# Patient Record
Sex: Male | Born: 1950
Health system: Southern US, Community
[De-identification: ages and names within clinical notes are randomized; demographics above are authoritative.]

## PROBLEM LIST (undated history)

## (undated) DIAGNOSIS — M1990 Unspecified osteoarthritis, unspecified site: Secondary | ICD-10-CM

## (undated) DIAGNOSIS — Z87442 Personal history of urinary calculi: Secondary | ICD-10-CM

## (undated) DIAGNOSIS — E785 Hyperlipidemia, unspecified: Secondary | ICD-10-CM

## (undated) DIAGNOSIS — B019 Varicella without complication: Secondary | ICD-10-CM

## (undated) DIAGNOSIS — H269 Unspecified cataract: Secondary | ICD-10-CM

## (undated) DIAGNOSIS — C801 Malignant (primary) neoplasm, unspecified: Secondary | ICD-10-CM

## (undated) DIAGNOSIS — R7303 Prediabetes: Secondary | ICD-10-CM

## (undated) DIAGNOSIS — N200 Calculus of kidney: Secondary | ICD-10-CM

## (undated) DIAGNOSIS — E119 Type 2 diabetes mellitus without complications: Secondary | ICD-10-CM

## (undated) HISTORY — DX: Malignant (primary) neoplasm, unspecified: C80.1

## (undated) HISTORY — DX: Varicella without complication: B01.9

## (undated) HISTORY — DX: Type 2 diabetes mellitus without complications: E11.9

## (undated) HISTORY — DX: Unspecified cataract: H26.9

## (undated) HISTORY — DX: Calculus of kidney: N20.0

## (undated) HISTORY — DX: Hyperlipidemia, unspecified: E78.5

## (undated) HISTORY — PX: OTHER SURGICAL HISTORY: SHX169

---
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
## 2003-01-30 HISTORY — PX: COLONOSCOPY: SHX174

## 2009-05-06 ENCOUNTER — Ambulatory Visit: Payer: Self-pay | Admitting: Diagnostic Radiology

## 2009-05-06 ENCOUNTER — Emergency Department (HOSPITAL_BASED_OUTPATIENT_CLINIC_OR_DEPARTMENT_OTHER): Admission: EM | Admit: 2009-05-06 | Discharge: 2009-05-06 | Payer: Self-pay | Admitting: Emergency Medicine

## 2011-01-09 LAB — URINALYSIS, ROUTINE W REFLEX MICROSCOPIC
Bilirubin Urine: NEGATIVE
Ketones, ur: NEGATIVE mg/dL
Nitrite: NEGATIVE
Specific Gravity, Urine: 1.019 (ref 1.005–1.030)
Urobilinogen, UA: 0.2 mg/dL (ref 0.0–1.0)
pH: 5.5 (ref 5.0–8.0)

## 2011-01-09 LAB — URINE MICROSCOPIC-ADD ON

## 2015-06-10 DIAGNOSIS — Z23 Encounter for immunization: Secondary | ICD-10-CM | POA: Diagnosis not present

## 2016-01-08 ENCOUNTER — Ambulatory Visit: Payer: Self-pay | Admitting: Family Medicine

## 2016-04-12 ENCOUNTER — Ambulatory Visit: Payer: Self-pay | Admitting: Family Medicine

## 2016-05-19 ENCOUNTER — Telehealth: Payer: Self-pay | Admitting: *Deleted

## 2016-05-19 NOTE — Telephone Encounter (Signed)
Unable to reach patient at time of Pre-Visit Call.  Left message for patient to return call when available.    

## 2016-05-20 ENCOUNTER — Encounter: Payer: Self-pay | Admitting: Family Medicine

## 2016-05-20 ENCOUNTER — Ambulatory Visit (INDEPENDENT_AMBULATORY_CARE_PROVIDER_SITE_OTHER): Payer: Medicare Other | Admitting: Family Medicine

## 2016-05-20 VITALS — BP 136/86 | HR 73 | Temp 98.4°F | Ht 69.0 in | Wt 257.6 lb

## 2016-05-20 DIAGNOSIS — Z130 Encounter for screening for diseases of the blood and blood-forming organs and certain disorders involving the immune mechanism: Secondary | ICD-10-CM

## 2016-05-20 DIAGNOSIS — Z13220 Encounter for screening for lipoid disorders: Secondary | ICD-10-CM

## 2016-05-20 DIAGNOSIS — E663 Overweight: Secondary | ICD-10-CM | POA: Diagnosis not present

## 2016-05-20 DIAGNOSIS — Z131 Encounter for screening for diabetes mellitus: Secondary | ICD-10-CM

## 2016-05-20 DIAGNOSIS — Z5181 Encounter for therapeutic drug level monitoring: Secondary | ICD-10-CM

## 2016-05-20 DIAGNOSIS — Z125 Encounter for screening for malignant neoplasm of prostate: Secondary | ICD-10-CM

## 2016-05-20 DIAGNOSIS — Z119 Encounter for screening for infectious and parasitic diseases, unspecified: Secondary | ICD-10-CM | POA: Diagnosis not present

## 2016-05-20 DIAGNOSIS — R972 Elevated prostate specific antigen [PSA]: Secondary | ICD-10-CM

## 2016-05-20 DIAGNOSIS — R0683 Snoring: Secondary | ICD-10-CM

## 2016-05-20 DIAGNOSIS — Z23 Encounter for immunization: Secondary | ICD-10-CM | POA: Diagnosis not present

## 2016-05-20 LAB — CBC
HEMATOCRIT: 42.5 % (ref 39.0–52.0)
HEMOGLOBIN: 14.4 g/dL (ref 13.0–17.0)
MCHC: 34 g/dL (ref 30.0–36.0)
MCV: 96.2 fl (ref 78.0–100.0)
Platelets: 266 10*3/uL (ref 150.0–400.0)
RBC: 4.42 Mil/uL (ref 4.22–5.81)
RDW: 14 % (ref 11.5–15.5)
WBC: 5.6 10*3/uL (ref 4.0–10.5)

## 2016-05-20 LAB — COMPREHENSIVE METABOLIC PANEL
ALT: 18 U/L (ref 0–53)
AST: 16 U/L (ref 0–37)
Albumin: 4.3 g/dL (ref 3.5–5.2)
Alkaline Phosphatase: 69 U/L (ref 39–117)
BUN: 21 mg/dL (ref 6–23)
CHLORIDE: 108 meq/L (ref 96–112)
CO2: 27 meq/L (ref 19–32)
Calcium: 9.5 mg/dL (ref 8.4–10.5)
Creatinine, Ser: 0.95 mg/dL (ref 0.40–1.50)
GFR: 84.17 mL/min (ref >60.00)
GLUCOSE: 113 mg/dL — AB (ref 70–99)
POTASSIUM: 4.3 meq/L (ref 3.5–5.1)
Sodium: 142 mEq/L (ref 135–145)
Total Bilirubin: 0.7 mg/dL (ref 0.2–1.2)
Total Protein: 6.8 g/dL (ref 6.0–8.3)

## 2016-05-20 LAB — LIPID PANEL
CHOL/HDL RATIO: 3
Cholesterol: 190 mg/dL (ref 0–200)
HDL: 54.9 mg/dL (ref >39.00)
LDL CALC: 122 mg/dL — AB (ref 0–99)
NONHDL: 135.29
Triglycerides: 64 mg/dL (ref 0.0–149.0)
VLDL: 12.8 mg/dL (ref 0.0–40.0)

## 2016-05-20 LAB — PSA, MEDICARE: PSA: 6.29 ng/ml — ABNORMAL HIGH (ref 0.10–4.00)

## 2016-05-20 LAB — HEPATITIS C ANTIBODY: HCV Ab: NEGATIVE

## 2016-05-20 NOTE — Progress Notes (Addendum)
[place] at [facility] [address], [place], [place] [address] [phone]  Date:  05/20/2016   Name:  Jason Obrien   DOB:  [birth year]   MRN:  [id number]  PCP:  Lamar Blinks, MD    Chief Complaint: Establish Care (Pt is here to est care. )   History of Present Illness:  Jason Obrien is a 65 y.o. very pleasant male patient who presents with the following:  He is here today as a new patient- he was a [place] pt in the past but does not really seek medical care.   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   No recent BW- he is fasting today so we can draw blood.   He is married, his 2 children are 87 and 54 yo, doing well.  They live pretty close; 1 grandson born last year.    His wife has noted that he snores- and does seem to have some episodes where he will stop breathing.  He does not feel tired during the day, does not feel sleepy or somnolent  His father had OSA treated with CPAP He did have a colonoscopy in 2004;  He plans to do this soon   There are no active problems to display for this patient.   Past Medical History:  Diagnosis Date  . Chicken pox    childhood  . Kidney stones    In 2013 or 2014    No past surgical history on file.  Social History  Substance Use Topics  . Smoking status: Never Smoker  . Smokeless tobacco: Never Used  . Alcohol use Yes     Comment: Social only     No family history on file.  Allergies not on file  Medication list has been reviewed and updated.  No current outpatient prescriptions on file prior to visit.   No current facility-administered medications on file prior to visit.     Review of Systems:  As per HPI- otherwise negative.   Physical Examination: BP Readings from Last 3 Encounters:  05/20/16 136/86   Vitals:   05/20/16 0834  Pulse: 73   Temp: 98.4 F (36.9 C)   Vitals:   05/20/16 0834  Weight: 257 lb 9.6 oz (116.8 kg)  Height: 5\' 9"  (1.753 m)   Body mass index is 38.04 kg/m. Ideal Body Weight: Weight in (lb) to have BMI = 25: 168.9  GEN: WDWN, NAD, Non-toxic, A & O x 3 HEENT: Atraumatic, Normocephalic. Neck supple. No masses, No LAD. Ears and Nose: No external deformity. CV: RRR, No M/G/R. No JVD. No thrill. No extra heart sounds. PULM: CTA B, no wheezes, crackles, rhonchi. No retractions. No resp. distress. No accessory muscle use. ABD: S, NT, ND, +BS. No rebound. No HSM. EXTR: No c/c/e NEURO Normal gait.  PSYCH: Normally interactive. Conversant. Not depressed or anxious appearing.  Calm demeanor.    Assessment and Plan: Snoring - Plan: Ambulatory referral to Neurology  Screening for deficiency anemia - Plan: CBC  Screening for hyperlipidemia - Plan: Lipid panel  Overweight - Plan: Lipid panel, Ambulatory referral to Neurology  Screening for prostate cancer - Plan: PSA, Medicare  Screening for diabetes mellitus - Plan: Comprehensive metabolic panel  Medication monitoring encounter - Plan: CBC, Comprehensive metabolic panel  Screening examination for infectious  disease - Plan: Hepatitis C antibody  Immunization due - Plan: Pneumococcal conjugate vaccine 13-valent IM  Elevated PSA - Plan: Ambulatory referral to Urology  here today to establish care and address issues as above Will refer to neurology to have a sleep study Will plan further follow- up pending labs.  It was very nice to meet you today- I will be in touch with your labs asap We will arrange for you to have a sleep study with neurology. Please do go ahead with a colonoscopy appt at your convenience.   Losing weight is a great idea- this is a challenge but keep working on it You got your prevnar vaccine today- you will be due for your pneumovax (other pneumonia vaccine) next year You might also consider getting a shingles vaccine from  your pharmacy - wait a month after your prevnar however  Signed Lamar Blinks, MD  Received his labs 8/17, gave him a call.  LMOM; CBC, cholesterol good.  Glucose is borderline- please work on lifestyle and recheck with me in 2 months.  PSA is high- will refer to urology as he may need a bx   Results for orders placed or performed in visit on 05/20/16  CBC  Result Value Ref Range   WBC 5.6 4.0 - 10.5 K/uL   RBC 4.42 4.22 - 5.81 Mil/uL   Platelets 266.0 150.0 - 400.0 K/uL   Hemoglobin 14.4 13.0 - 17.0 g/dL   HCT 42.5 39.0 - 52.0 %   MCV 96.2 78.0 - 100.0 fl   MCHC 34.0 30.0 - 36.0 g/dL   RDW 14.0 11.5 - 15.5 %  Comprehensive metabolic panel  Result Value Ref Range   Sodium 142 135 - 145 mEq/L   Potassium 4.3 3.5 - 5.1 mEq/L   Chloride 108 96 - 112 mEq/L   CO2 27 19 - 32 mEq/L   Glucose, Bld 113 (H) 70 - 99 mg/dL   BUN 21 6 - 23 mg/dL   Creatinine, Ser 0.95 0.40 - 1.50 mg/dL   Total Bilirubin 0.7 0.2 - 1.2 mg/dL   Alkaline Phosphatase 69 39 - 117 U/L   AST 16 0 - 37 U/L   ALT 18 0 - 53 U/L   Total Protein 6.8 6.0 - 8.3 g/dL   Albumin 4.3 3.5 - 5.2 g/dL   Calcium 9.5 8.4 - 10.5 mg/dL   GFR 84.17 >60.00 mL/min  Lipid panel  Result Value Ref Range   Cholesterol 190 0 - 200 mg/dL   Triglycerides 64.0 0.0 - 149.0 mg/dL   HDL 54.90 >39.00 mg/dL   VLDL 12.8 0.0 - 40.0 mg/dL   LDL Cholesterol 122 (H) 0 - 99 mg/dL   Total CHOL/HDL Ratio 3    NonHDL 135.29   PSA, Medicare  Result Value Ref Range   PSA 6.29 (H) 0.10 - 4.00 ng/ml

## 2016-05-20 NOTE — Progress Notes (Signed)
Pre visit review using our clinic review tool, if applicable. No additional management support is needed unless otherwise documented below in the visit note. 

## 2016-05-20 NOTE — Patient Instructions (Addendum)
It was very nice to meet you today- I will be in touch with your labs asap We will arrange for you to have a sleep study with neurology. Please do go ahead with a colonoscopy appt at your convenience.   Losing weight is a great idea- this is a challenge but keep working on it You got your prevnar vaccine today- you will be due for your pneumovax (other pneumonia vaccine) next year You might also consider getting a shingles vaccine from your pharmacy - wait a month after your prevnar however

## 2016-05-20 NOTE — Addendum Note (Signed)
Addended by: Lamar Blinks C on: 05/20/2016 06:08 PM   Modules accepted: Orders

## 2016-05-21 ENCOUNTER — Encounter: Payer: Self-pay | Admitting: Gastroenterology

## 2016-05-27 DIAGNOSIS — Z6836 Body mass index (BMI) 36.0-36.9, adult: Secondary | ICD-10-CM | POA: Diagnosis not present

## 2016-05-27 DIAGNOSIS — R972 Elevated prostate specific antigen [PSA]: Secondary | ICD-10-CM | POA: Diagnosis not present

## 2016-05-31 ENCOUNTER — Encounter: Payer: Self-pay | Admitting: Family Medicine

## 2016-05-31 DIAGNOSIS — R972 Elevated prostate specific antigen [PSA]: Secondary | ICD-10-CM | POA: Insufficient documentation

## 2016-06-10 DIAGNOSIS — C61 Malignant neoplasm of prostate: Secondary | ICD-10-CM | POA: Diagnosis not present

## 2016-06-10 DIAGNOSIS — R972 Elevated prostate specific antigen [PSA]: Secondary | ICD-10-CM | POA: Diagnosis not present

## 2016-06-17 DIAGNOSIS — C61 Malignant neoplasm of prostate: Secondary | ICD-10-CM | POA: Diagnosis not present

## 2016-06-17 DIAGNOSIS — Z6836 Body mass index (BMI) 36.0-36.9, adult: Secondary | ICD-10-CM | POA: Diagnosis not present

## 2016-06-28 DIAGNOSIS — Z6836 Body mass index (BMI) 36.0-36.9, adult: Secondary | ICD-10-CM | POA: Diagnosis not present

## 2016-06-28 DIAGNOSIS — C61 Malignant neoplasm of prostate: Secondary | ICD-10-CM | POA: Diagnosis not present

## 2016-06-28 DIAGNOSIS — Z9889 Other specified postprocedural states: Secondary | ICD-10-CM | POA: Diagnosis not present

## 2016-07-07 DIAGNOSIS — N200 Calculus of kidney: Secondary | ICD-10-CM | POA: Diagnosis not present

## 2016-07-07 DIAGNOSIS — Z6835 Body mass index (BMI) 35.0-35.9, adult: Secondary | ICD-10-CM | POA: Diagnosis not present

## 2016-07-07 DIAGNOSIS — C61 Malignant neoplasm of prostate: Secondary | ICD-10-CM | POA: Diagnosis not present

## 2016-07-21 ENCOUNTER — Encounter: Payer: Self-pay | Admitting: Family Medicine

## 2016-07-21 ENCOUNTER — Ambulatory Visit (INDEPENDENT_AMBULATORY_CARE_PROVIDER_SITE_OTHER): Payer: Medicare Other | Admitting: Family Medicine

## 2016-07-21 VITALS — BP 127/85 | HR 70 | Temp 98.7°F | Ht 69.0 in | Wt 247.2 lb

## 2016-07-21 DIAGNOSIS — Z23 Encounter for immunization: Secondary | ICD-10-CM

## 2016-07-21 DIAGNOSIS — E669 Obesity, unspecified: Secondary | ICD-10-CM

## 2016-07-21 DIAGNOSIS — R7309 Other abnormal glucose: Secondary | ICD-10-CM

## 2016-07-21 DIAGNOSIS — C61 Malignant neoplasm of prostate: Secondary | ICD-10-CM | POA: Diagnosis not present

## 2016-07-21 DIAGNOSIS — R7303 Prediabetes: Secondary | ICD-10-CM | POA: Insufficient documentation

## 2016-07-21 LAB — HEMOGLOBIN A1C: Hgb A1c MFr Bld: 5.9 % (ref 4.6–6.5)

## 2016-07-21 NOTE — Progress Notes (Signed)
Pre visit review using our clinic review tool, if applicable. No additional management support is needed unless otherwise documented below in the visit note. 

## 2016-07-21 NOTE — Progress Notes (Signed)
[place] at [facility] [address], [place], [place] [address] [phone] [id number] [phone]  Date:  07/21/2016   Name:  Jason Obrien   DOB:  06/30/1951   MRN:  [id number]  PCP:  Lamar Blinks, MD    Chief Complaint: Follow-up (Pt here for f/u visit. Pt has not had sleep study yet. Pt states that he was recently dx with prostate cancer and will surgery on Dec. 12th. )   History of Present Illness:  Jason Obrien is a 65 y.o. very pleasant male patient who presents with the following:  Here today for a follow-up visit.  I last saw him in August- HPI from that visit  He is here today as a new patient- he was a [facility] pt in the past but does not really seek medical care.   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   No recent BW- he is fasting today so we can draw blood.   He is married, his 2 children are 61 and 78 yo, doing well.  They live pretty close; 1 grandson born last year.    His wife has noted that he snores- and does seem to have some episodes where he will stop breathing.  He does not feel tired during the day, does not feel sleepy or somnolent  His father had OSA treated with CPAP He did have a colonoscopy in 2004;  He plans to do this soon  He was recently noted to have an elevated PSA and then dx with prostate cancer- he is seeing Dr. Nevada Crane with [facility].  He discussed radiation treatment and robotic prostatectomy. He plans to have surgery in December.  He will have his colonoscopy in November.  The sleep study is on the back burner for the time being.  However he hopes that losing weight will help to reduce snoring.    Wt Readings from Last 3 Encounters:  07/21/16 247 lb 3.2 oz (112.1 kg)  05/20/16 257 lb 9.6 oz (116.8 kg)   He has worked on losing weight- he has lost about 10 lbs since our last visit.  He has started  walking in the mornings, and is following some of his wife's diet   His grandson recently celebrated his 1st b-day in [place]; this was a great event for them  Patient Active Problem List   Diagnosis Date Noted  . Elevated PSA 05/31/2016  . Overweight 05/20/2016    Past Medical History:  Diagnosis Date  . Chicken pox    childhood  . Kidney stones    In 2013 or 2014    Past Surgical History:  Procedure Laterality Date  . OTHER SURGICAL HISTORY     Right arm surgery for compound fracture     Social History  Substance Use Topics  . Smoking status: Never Smoker  . Smokeless tobacco: Never Used  . Alcohol use Yes     Comment: Social only     Family History  Problem Relation Age of Onset  . Hypertension Father   . Heart failure Father     died age 20  . Colon cancer Paternal Grandmother     No Known Allergies  Medication list has been reviewed and updated.  Current Outpatient Prescriptions on File Prior to Visit  Medication Sig Dispense Refill  .  aspirin (BAYER ASPIRIN EC LOW DOSE) 81 MG EC tablet Take 81 mg by mouth daily. Swallow whole.    . Glucosamine-Chondroit-Vit C-Mn (GLUCOSAMINE 1500 COMPLEX) CAPS Take 1 capsule by mouth daily.    . Misc Natural Products (PROSTATE HEALTH) CAPS Take 1 capsule by mouth daily.    . Multiple Vitamins-Minerals (ONE-A-DAY MENS 50+ ADVANTAGE PO) Take 1 capsule by mouth daily.    . Omega-3 Fatty Acids (FISH OIL ULTRA) 1400 MG CAPS Take 1 capsule by mouth daily.     No current facility-administered medications on file prior to visit.     Review of Systems:  As per HPI- otherwise negative.  No fever, chills, CP, SOB.  Emotionally he is doing ok with his recent cancer dx- he knows several people who have been though this and done well so he is hopeful   Physical Examination: Vitals:   07/21/16 0854  BP: 127/85  Pulse: 70  Temp: 98.7 F (37.1 C)   Vitals:   07/21/16 0854  Weight: 247 lb 3.2 oz (112.1 kg)  Height: 5'  9" (1.753 m)   Body mass index is 36.51 kg/m. Ideal Body Weight: Weight in (lb) to have BMI = 25: 168.9  GEN: WDWN, NAD, Non-toxic, A & O x 3, obese but has lost, otherwise looks well HEENT: Atraumatic, Normocephalic. Neck supple. No masses, No LAD. Ears and Nose: No external deformity. CV: RRR, No M/G/R. No JVD. No thrill. No extra heart sounds. PULM: CTA B, no wheezes, crackles, rhonchi. No retractions. No resp. distress. No accessory muscle use. EXTR: No c/c/e NEURO Normal gait.  PSYCH: Normally interactive. Conversant. Not depressed or anxious appearing.  Calm demeanor.    Assessment and Plan: Elevated glucose - Plan: Hemoglobin A1C  Prostate cancer (HCC)  Obesity, unspecified classification, unspecified obesity type, unspecified whether serious comorbidity present  Recent labs showed elevated glucose- check A1c today Flu shot Discussed his prostate cancer- he is confident in his urologist and plan for surgery He has lost weight!  Keep it up  [place], MD

## 2016-07-21 NOTE — Patient Instructions (Signed)
It was great to see you today- you got your flu shot, and we will check an A1c test to get an idea of your average blood sugar level Great job with weight loss and getting in shape!  Keep up the good work We will be thinking of you as you go through your surgery.  Please let me know if we can be of any help to you during this time

## 2016-08-02 ENCOUNTER — Ambulatory Visit (AMBULATORY_SURGERY_CENTER): Payer: Self-pay | Admitting: *Deleted

## 2016-08-02 VITALS — Ht 70.0 in | Wt 251.0 lb

## 2016-08-02 DIAGNOSIS — Z1211 Encounter for screening for malignant neoplasm of colon: Secondary | ICD-10-CM

## 2016-08-02 MED ORDER — NA SULFATE-K SULFATE-MG SULF 17.5-3.13-1.6 GM/177ML PO SOLN: ORAL | 0 refills | Status: DC

## 2016-08-02 NOTE — Progress Notes (Signed)
Patient denies any allergies to eggs or soy. Patient denies any problems with anesthesia/sedation. Patient denies any oxygen use at home and does not take any diet/weight loss medications. Pt declined EMMI education. 

## 2016-08-16 ENCOUNTER — Ambulatory Visit (AMBULATORY_SURGERY_CENTER): Payer: Medicare Other | Admitting: Gastroenterology

## 2016-08-16 ENCOUNTER — Encounter: Payer: Self-pay | Admitting: Gastroenterology

## 2016-08-16 VITALS — BP 111/67 | HR 59 | Temp 96.9°F | Resp 10 | Ht 70.0 in | Wt 251.0 lb

## 2016-08-16 DIAGNOSIS — Z8371 Family history of colonic polyps: Secondary | ICD-10-CM

## 2016-08-16 DIAGNOSIS — Z1212 Encounter for screening for malignant neoplasm of rectum: Secondary | ICD-10-CM

## 2016-08-16 DIAGNOSIS — Z1211 Encounter for screening for malignant neoplasm of colon: Secondary | ICD-10-CM

## 2016-08-16 DIAGNOSIS — K635 Polyp of colon: Secondary | ICD-10-CM | POA: Diagnosis not present

## 2016-08-16 DIAGNOSIS — Z800 Family history of malignant neoplasm of digestive organs: Secondary | ICD-10-CM

## 2016-08-16 DIAGNOSIS — D125 Benign neoplasm of sigmoid colon: Secondary | ICD-10-CM

## 2016-08-16 MED ORDER — SODIUM CHLORIDE 0.9 % IV SOLN: 500.0000 mL | INTRAVENOUS | Status: DC

## 2016-08-16 NOTE — Progress Notes (Signed)
Called to room to assist during endoscopic procedure.  Patient ID and intended procedure confirmed with present staff. Received instructions for my participation in the procedure from the performing physician.  

## 2016-08-16 NOTE — Progress Notes (Signed)
A/ox3, pleased with MAC, report to RN 

## 2016-08-16 NOTE — Op Note (Signed)
[facility] Patient Name: Jason Obrien Procedure Date: 08/16/2016 8:04 AM MRN: [id number] Endoscopist: Ladene Artist , MD Age: 65 Referring MD:  Date of Birth: [birth year] Gender: Male Account #: [id number] Procedure:                Colonoscopy Indications:              Screening for colon cancer: Family history of                            colorectal cancer in distant relative(s), Colon                            cancer screening in patient at increased risk:                            Family history of 1st-degree relative with colon                            polyps Medicines:                Monitored Anesthesia Care Procedure:                Pre-Anesthesia Assessment:                           - Prior to the procedure, a History and Physical                            was performed, and patient medications and                            allergies were reviewed. The patient's tolerance of                            previous anesthesia was also reviewed. The risks                            and benefits of the procedure and the sedation                            options and risks were discussed with the patient.                            All questions were answered, and informed consent                            was obtained. Prior Anticoagulants: The patient has                            taken no previous anticoagulant or antiplatelet                            agents. ASA Grade Assessment: II - A patient with  mild systemic disease. After reviewing the risks                            and benefits, the patient was deemed in                            satisfactory condition to undergo the procedure.                           After obtaining informed consent, the colonoscope                            was passed under direct vision. Throughout the                            procedure, the patient's blood pressure, pulse, and               oxygen saturations were monitored continuously. The                            Model [id number]-[id number]) scope was introduced                            through the anus and advanced to the the cecum,                            identified by appendiceal orifice and ileocecal                            valve. The ileocecal valve, appendiceal orifice,                            and rectum were photographed. The quality of the                            bowel preparation was good. The colonoscopy was                            performed without difficulty. The patient tolerated                            the procedure well. Scope In: 8:08:02 AM Scope Out: 8:22:20 AM Scope Withdrawal Time: 0 hours 12 minutes 28 seconds  Total Procedure Duration: 0 hours 14 minutes 18 seconds  Findings:                 The perianal and digital rectal examinations were                            normal.                           A 6 mm polyp was found in the sigmoid colon. The  polyp was sessile. The polyp was removed with a                            cold snare. Resection and retrieval were complete.                           Internal hemorrhoids were found during                            retroflexion. The hemorrhoids were small and Grade                            I (internal hemorrhoids that do not prolapse).                           Multiple small-mouthed diverticula were found in                            the sigmoid colon and descending colon. There was                            evidence of diverticular spasm.                           The exam was otherwise without abnormality on                            direct and retroflexion views. Complications:            No immediate complications. Estimated blood loss:                            None. Estimated Blood Loss:     Estimated blood loss: none. Impression:               - One 6 mm polyp in the sigmoid  colon, removed with                            a cold snare. Resected and retrieved.                           - Internal hemorrhoids.                           - Moderate diverticulosis in the sigmoid colon and                            in the descending colon.                           - The examination was otherwise normal on direct                            and retroflexion views. Recommendation:           - Repeat colonoscopy in 5 years for surveillance.                           -  Patient has a contact number available for                            emergencies. The signs and symptoms of potential                            delayed complications were discussed with the                            patient. Return to normal activities tomorrow.                            Written discharge instructions were provided to the                            patient.                           - High fiber diet.                           - Continue present medications.                           - Await pathology results. Ladene Artist, MD 08/16/2016 8:25:41 AM This report has been signed electronically.

## 2016-08-16 NOTE — Patient Instructions (Signed)
YOU HAD AN ENDOSCOPIC PROCEDURE TODAY AT THE [facility]:   Refer to the procedure report that was given to you for any specific questions about what was found during the examination.  If the procedure report does not answer your questions, please call your gastroenterologist to clarify.  If you requested that your care partner not be given the details of your procedure findings, then the procedure report has been included in a sealed envelope for you to review at your convenience later.  YOU SHOULD EXPECT: Some feelings of bloating in the abdomen. Passage of more gas than usual.  Walking can help get rid of the air that was put into your GI tract during the procedure and reduce the bloating. If you had a lower endoscopy (such as a colonoscopy or flexible sigmoidoscopy) you may notice spotting of blood in your stool or on the toilet paper. If you underwent a bowel prep for your procedure, you may not have a normal bowel movement for a few days.  Please Note:  You might notice some irritation and congestion in your nose or some drainage.  This is from the oxygen used during your procedure.  There is no need for concern and it should clear up in a day or so.  SYMPTOMS TO REPORT IMMEDIATELY:   Following lower endoscopy (colonoscopy or flexible sigmoidoscopy):  Excessive amounts of blood in the stool  Significant tenderness or worsening of abdominal pains  Swelling of the abdomen that is new, acute  Fever of 100F or higher    For urgent or emergent issues, a gastroenterologist can be reached at any hour by calling [phone].   DIET:  We do recommend a small meal at first, but then you may proceed to your regular diet.  Drink plenty of fluids but you should avoid alcoholic beverages for 24 hours.  ACTIVITY:  You should plan to take it easy for the rest of today and you should NOT DRIVE or use heavy machinery until tomorrow (because of the sedation medicines used during the test).     FOLLOW UP: Our staff will call the number listed on your records the next business day following your procedure to check on you and address any questions or concerns that you may have regarding the information given to you following your procedure. If we do not reach you, we will leave a message.  However, if you are feeling well and you are not experiencing any problems, there is no need to return our call.  We will assume that you have returned to your regular daily activities without incident.  If any biopsies were taken you will be contacted by phone or by letter within the next 1-3 weeks.  Please call us at [phone] if you have not heard about the biopsies in 3 weeks.    SIGNATURES/CONFIDENTIALITY: You and/or your care partner have signed paperwork which will be entered into your electronic medical record.  These signatures attest to the fact that that the information above on your After Visit Summary has been reviewed and is understood.  Full responsibility of the confidentiality of this discharge information lies with you and/or your care-partner.   Resume medications. Information given on polyps,diverticulosis and high fiber diet. 

## 2016-08-17 ENCOUNTER — Telehealth: Payer: Self-pay

## 2016-08-17 ENCOUNTER — Telehealth: Payer: Self-pay | Admitting: *Deleted

## 2016-08-17 NOTE — Telephone Encounter (Signed)
No answer left message to call if questions or concerns. 

## 2016-08-17 NOTE — Telephone Encounter (Signed)
  Follow up Call-  Call back number 08/16/2016  Post procedure Call Back phone  # [phone]  Permission to leave phone message Yes  Some recent data might be hidden    Patient was called for follow up after his procedure on 08/16/2016. No answer at the number given for follow up phone call. A message was left on the answering machine.

## 2016-08-30 ENCOUNTER — Encounter: Payer: Self-pay | Admitting: Gastroenterology

## 2016-09-08 DIAGNOSIS — N200 Calculus of kidney: Secondary | ICD-10-CM | POA: Diagnosis not present

## 2016-09-08 DIAGNOSIS — Z6835 Body mass index (BMI) 35.0-35.9, adult: Secondary | ICD-10-CM | POA: Diagnosis not present

## 2016-09-08 DIAGNOSIS — C61 Malignant neoplasm of prostate: Secondary | ICD-10-CM | POA: Diagnosis not present

## 2016-09-08 DIAGNOSIS — Z01818 Encounter for other preprocedural examination: Secondary | ICD-10-CM | POA: Diagnosis not present

## 2016-09-14 DIAGNOSIS — C7911 Secondary malignant neoplasm of bladder: Secondary | ICD-10-CM | POA: Diagnosis present

## 2016-09-14 DIAGNOSIS — C61 Malignant neoplasm of prostate: Secondary | ICD-10-CM | POA: Diagnosis not present

## 2016-09-14 HISTORY — PX: OTHER SURGICAL HISTORY: SHX169

## 2016-09-16 ENCOUNTER — Encounter: Payer: Self-pay | Admitting: Family Medicine

## 2016-09-22 DIAGNOSIS — C61 Malignant neoplasm of prostate: Secondary | ICD-10-CM | POA: Diagnosis not present

## 2016-12-15 DIAGNOSIS — C61 Malignant neoplasm of prostate: Secondary | ICD-10-CM | POA: Diagnosis not present

## 2016-12-22 DIAGNOSIS — N200 Calculus of kidney: Secondary | ICD-10-CM | POA: Diagnosis not present

## 2016-12-22 DIAGNOSIS — C61 Malignant neoplasm of prostate: Secondary | ICD-10-CM | POA: Diagnosis not present

## 2016-12-28 ENCOUNTER — Telehealth: Payer: Self-pay | Admitting: Family Medicine

## 2016-12-28 NOTE — Telephone Encounter (Signed)
Left pt message asking to call Allison back directly at [phone] to schedule AWV. Thanks! °

## 2017-01-10 DIAGNOSIS — C61 Malignant neoplasm of prostate: Secondary | ICD-10-CM | POA: Diagnosis not present

## 2017-01-26 DIAGNOSIS — C61 Malignant neoplasm of prostate: Secondary | ICD-10-CM | POA: Diagnosis not present

## 2017-02-10 DIAGNOSIS — Z7982 Long term (current) use of aspirin: Secondary | ICD-10-CM | POA: Diagnosis not present

## 2017-02-10 DIAGNOSIS — Z6836 Body mass index (BMI) 36.0-36.9, adult: Secondary | ICD-10-CM | POA: Diagnosis not present

## 2017-02-10 DIAGNOSIS — C61 Malignant neoplasm of prostate: Secondary | ICD-10-CM | POA: Diagnosis not present

## 2017-02-10 DIAGNOSIS — Z9079 Acquired absence of other genital organ(s): Secondary | ICD-10-CM | POA: Diagnosis not present

## 2017-02-10 NOTE — Telephone Encounter (Signed)
Left pt message asking to call Allison back directly at [phone] to schedule AWV. Thanks! °

## 2017-03-09 DIAGNOSIS — C61 Malignant neoplasm of prostate: Secondary | ICD-10-CM | POA: Diagnosis not present

## 2017-03-24 DIAGNOSIS — C61 Malignant neoplasm of prostate: Secondary | ICD-10-CM | POA: Diagnosis not present

## 2017-03-24 DIAGNOSIS — Z9079 Acquired absence of other genital organ(s): Secondary | ICD-10-CM | POA: Diagnosis not present

## 2017-03-24 DIAGNOSIS — Z7982 Long term (current) use of aspirin: Secondary | ICD-10-CM | POA: Diagnosis not present

## 2017-03-28 DIAGNOSIS — C61 Malignant neoplasm of prostate: Secondary | ICD-10-CM | POA: Diagnosis not present

## 2017-03-29 DIAGNOSIS — C61 Malignant neoplasm of prostate: Secondary | ICD-10-CM | POA: Diagnosis not present

## 2017-03-30 DIAGNOSIS — C61 Malignant neoplasm of prostate: Secondary | ICD-10-CM | POA: Diagnosis not present

## 2017-03-31 DIAGNOSIS — C61 Malignant neoplasm of prostate: Secondary | ICD-10-CM | POA: Diagnosis not present

## 2017-04-01 DIAGNOSIS — C61 Malignant neoplasm of prostate: Secondary | ICD-10-CM | POA: Diagnosis not present

## 2017-04-04 DIAGNOSIS — Z7982 Long term (current) use of aspirin: Secondary | ICD-10-CM | POA: Diagnosis not present

## 2017-04-04 DIAGNOSIS — Z9079 Acquired absence of other genital organ(s): Secondary | ICD-10-CM | POA: Diagnosis not present

## 2017-04-04 DIAGNOSIS — C61 Malignant neoplasm of prostate: Secondary | ICD-10-CM | POA: Diagnosis not present

## 2017-04-05 DIAGNOSIS — C61 Malignant neoplasm of prostate: Secondary | ICD-10-CM | POA: Diagnosis not present

## 2017-04-07 DIAGNOSIS — C61 Malignant neoplasm of prostate: Secondary | ICD-10-CM | POA: Diagnosis not present

## 2017-04-08 DIAGNOSIS — C61 Malignant neoplasm of prostate: Secondary | ICD-10-CM | POA: Diagnosis not present

## 2017-04-11 DIAGNOSIS — C61 Malignant neoplasm of prostate: Secondary | ICD-10-CM | POA: Diagnosis not present

## 2017-04-12 DIAGNOSIS — C61 Malignant neoplasm of prostate: Secondary | ICD-10-CM | POA: Diagnosis not present

## 2017-04-13 ENCOUNTER — Encounter: Payer: Self-pay | Admitting: Family Medicine

## 2017-04-13 DIAGNOSIS — C61 Malignant neoplasm of prostate: Secondary | ICD-10-CM | POA: Diagnosis not present

## 2017-04-14 DIAGNOSIS — C61 Malignant neoplasm of prostate: Secondary | ICD-10-CM | POA: Diagnosis not present

## 2017-04-15 DIAGNOSIS — C61 Malignant neoplasm of prostate: Secondary | ICD-10-CM | POA: Diagnosis not present

## 2017-04-18 DIAGNOSIS — C61 Malignant neoplasm of prostate: Secondary | ICD-10-CM | POA: Diagnosis not present

## 2017-04-19 DIAGNOSIS — C61 Malignant neoplasm of prostate: Secondary | ICD-10-CM | POA: Diagnosis not present

## 2017-04-20 DIAGNOSIS — C61 Malignant neoplasm of prostate: Secondary | ICD-10-CM | POA: Diagnosis not present

## 2017-04-21 DIAGNOSIS — C61 Malignant neoplasm of prostate: Secondary | ICD-10-CM | POA: Diagnosis not present

## 2017-04-22 DIAGNOSIS — C61 Malignant neoplasm of prostate: Secondary | ICD-10-CM | POA: Diagnosis not present

## 2017-04-25 DIAGNOSIS — C61 Malignant neoplasm of prostate: Secondary | ICD-10-CM | POA: Diagnosis not present

## 2017-04-26 DIAGNOSIS — C61 Malignant neoplasm of prostate: Secondary | ICD-10-CM | POA: Diagnosis not present

## 2017-05-03 DIAGNOSIS — C61 Malignant neoplasm of prostate: Secondary | ICD-10-CM | POA: Diagnosis not present

## 2017-05-04 DIAGNOSIS — Z7982 Long term (current) use of aspirin: Secondary | ICD-10-CM | POA: Diagnosis not present

## 2017-05-04 DIAGNOSIS — C61 Malignant neoplasm of prostate: Secondary | ICD-10-CM | POA: Diagnosis not present

## 2017-05-04 DIAGNOSIS — Z9079 Acquired absence of other genital organ(s): Secondary | ICD-10-CM | POA: Diagnosis not present

## 2017-05-05 DIAGNOSIS — C61 Malignant neoplasm of prostate: Secondary | ICD-10-CM | POA: Diagnosis not present

## 2017-05-06 DIAGNOSIS — C61 Malignant neoplasm of prostate: Secondary | ICD-10-CM | POA: Diagnosis not present

## 2017-05-09 DIAGNOSIS — C61 Malignant neoplasm of prostate: Secondary | ICD-10-CM | POA: Diagnosis not present

## 2017-05-10 DIAGNOSIS — C61 Malignant neoplasm of prostate: Secondary | ICD-10-CM | POA: Diagnosis not present

## 2017-05-11 DIAGNOSIS — C61 Malignant neoplasm of prostate: Secondary | ICD-10-CM | POA: Diagnosis not present

## 2017-05-12 DIAGNOSIS — C61 Malignant neoplasm of prostate: Secondary | ICD-10-CM | POA: Diagnosis not present

## 2017-05-13 DIAGNOSIS — C61 Malignant neoplasm of prostate: Secondary | ICD-10-CM | POA: Diagnosis not present

## 2017-05-16 DIAGNOSIS — C61 Malignant neoplasm of prostate: Secondary | ICD-10-CM | POA: Diagnosis not present

## 2017-05-17 DIAGNOSIS — C61 Malignant neoplasm of prostate: Secondary | ICD-10-CM | POA: Diagnosis not present

## 2017-05-18 DIAGNOSIS — C61 Malignant neoplasm of prostate: Secondary | ICD-10-CM | POA: Diagnosis not present

## 2017-06-10 NOTE — Progress Notes (Addendum)
[place] [facility] [address], [place], [place] [address] [phone]  Date:  06/13/2017   Name:  Jason Obrien   DOB:  01-11-1951   MRN:  [id number]  PCP:  Jason Mclean, MD    Chief Complaint: Annual Exam (Pt here for CPE and is fasting for labs. Will get flu vaccine today. )   History of Present Illness:  Jason Obrien is a 66 y.o. very pleasant male patient who presents with the following:  Here today for a physical exam and wellness visit History of prostate cancer, pre- diabetes, and overweight Due for flu shot and pneumovax today Lat visit with me in October of last year:  He is here today as a new patient- he was a  pt in the past but does not really seek medical care.  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  No recent BW- he is fasting today so we can draw blood.   He is married, his 2 children are 58 and 62 yo, doing well. They live pretty close; 1 grandson born last year.   His wife has noted that he snores- and does seem to have some episodes where he will stop breathing.  He does not feel tired during the day, does not feel sleepy or somnolent  His father had OSA treated with CPAP He did have a colonoscopy in 2004;  He plans to do this soon  He was recently noted to have an elevated PSA and then dx with prostate cancer- he is seeing Dr. Nevada Obrien with [facility].  He discussed radiation treatment and robotic prostatectomy. He plans to have surgery in December.  He will have his colonoscopy in November.  The sleep study is on the back burner for the time being.  However he hopes that losing weight will help to reduce snoring.       Wt Readings from Last 3 Encounters:  07/21/16 247 lb 3.2 oz (112.1 kg)  05/20/16 257 lb 9.6 oz (116.8 kg)   He has worked on losing weight- he has lost about  10 lbs since our last visit.  He has started walking in the mornings, and is following some of his wife's diet   His grandson recently celebrated his 1st b-day in [place]; this was a great event for them  He had a prostatectomy in December- he had a positive margin and underwent radiation as well. He did 37 treatments and finished just about a month ago His urologist is Dr. Nevada Obrien They will repeat a PSA in about 3 months His bowel and bladder function changed a bit during his radiation- now improving however.   [facility] just bought his urology office from [facility]- he is not sure if this will bring any changes to him No chemo planned for him  His grandson is tuning 2 next month, and now he has a Geographical information systems officer as well He did get to [place] for a few days and then to the beach following his radiation treatment.  He is fasting today Pneumonia shot and flu shot today  Wt Readings from Last 3 Encounters:  06/13/17 254 lb 14.4 oz (115.6 kg)  08/16/16 251 lb (113.9 kg)  08/02/16 251 lb (113.9 kg)   Weight is stable Patient Active Problem List   Diagnosis Date  Noted  . Prostate cancer ([place]) 07/21/2016  . Pre-diabetes 07/21/2016  . Nephrolithiasis 07/07/2016  . Elevated PSA 05/31/2016  . Overweight 05/20/2016    Past Medical History:  Diagnosis Date  . Cancer [facility])    Prostate Cancer for SX in Dec.2017  . Chicken pox    childhood  . Kidney stones    In 2013 or 2014    Past Surgical History:  Procedure Laterality Date  . COLONOSCOPY  01/30/2003  . OTHER SURGICAL HISTORY     Right arm surgery for compound fracture   . robotic assisted lap radical prostatectomy N/A 09/14/2016   prostate cancer- done pe [facility], Dr. Nevada Obrien    Social History  Substance Use Topics  . Smoking status: Never Smoker  . Smokeless tobacco: Never Used  . Alcohol use 1.8 oz/week    3 Shots of liquor per week     Comment: Social only     Family History  Problem Relation Age of Onset  . Hypertension  Father   . Heart failure Father        died age 72  . Colon cancer Paternal Grandmother     No Known Allergies  Medication list has been reviewed and updated.  Current Outpatient Prescriptions on File Prior to Visit  Medication Sig Dispense Refill  . aspirin (BAYER ASPIRIN EC LOW DOSE) 81 MG EC tablet Take 81 mg by mouth daily. Swallow whole.    . Glucosamine-Chondroit-Vit C-Mn (GLUCOSAMINE 1500 COMPLEX) CAPS Take 1 capsule by mouth daily.    . Multiple Vitamins-Minerals (ONE-A-DAY MENS 50+ ADVANTAGE PO) Take 1 capsule by mouth daily.    . Omega-3 Fatty Acids (FISH OIL ULTRA) 1400 MG CAPS Take 1 capsule by mouth daily.     No current facility-administered medications on file prior to visit.     Review of Systems:  As per HPI- otherwise negative. No CP or SOB with exercise His wife wanted him to mention some external dilated veins on his bilateral legs- not painful, have been there for years    Physical Examination: Vitals:   06/13/17 0826  BP: 132/72  Pulse: 68  Temp: 98.2 F (36.8 C)  SpO2: 98%   Vitals:   06/13/17 0826  Weight: 254 lb 14.4 oz (115.6 kg)  Height: 5\' 9"  (1.753 m)   Body mass index is 37.64 kg/m. Ideal Body Weight: Weight in (lb) to have BMI = 25: 168.9  GEN: WDWN, NAD, Non-toxic, A & O x 3, overweight, looks well HEENT: Atraumatic, Normocephalic. Neck supple. No masses, No LAD.Bilateral TM wnl, oropharynx normal.  PEERL,EOMI.   Ears and Nose: No external deformity. CV: RRR, No M/G/R. No JVD. No thrill. No extra heart sounds. PULM: CTA B, no wheezes, crackles, rhonchi. No retractions. No resp. distress. No accessory muscle use. ABD: S, NT, ND, +BS. No rebound. No HSM. EXTR: No c/c/e NEURO Normal gait.  PSYCH: Normally interactive. Conversant. Not depressed or anxious appearing.  Calm demeanor.  Superficial dilated varicose veins on both LE- non tender Both ears were impacted with cerumen- removed with warm water irrigation and resolved.  Normal  TM, pt felt better    Assessment and Plan: Physical exam  Elevated glucose - Plan: Comprehensive metabolic panel, Hemoglobin A1c  Prostate cancer ([name])  Screening for hyperlipidemia - Plan: Lipid panel  Screening for deficiency anemia - Plan: CBC  Immunization due - Plan: Pneumococcal polysaccharide vaccine 23-valent greater than or equal to 2yo subcutaneous/IM  Bilateral impacted cerumen  CPE today Flu  shot and pneumovax 23 Resolved cerumen impaction bilaterally Labs pending as above- Will plan further follow- up pending labs. Encouraged exercise and a healthy diet   Signed Lamar Blinks, MD  Received his labs 9/11  Results for orders placed or performed in visit on 06/13/17  CBC  Result Value Ref Range   WBC 4.1 4.0 - 10.5 K/uL   RBC 4.27 4.22 - 5.81 Mil/uL   Platelets 267.0 150.0 - 400.0 K/uL   Hemoglobin 14.3 13.0 - 17.0 g/dL   HCT 42.4 39.0 - 52.0 %   MCV 99.4 78.0 - 100.0 fl   MCHC 33.7 30.0 - 36.0 g/dL   RDW 14.1 11.5 - 15.5 %  Comprehensive metabolic panel  Result Value Ref Range   Sodium 140 135 - 145 mEq/L   Potassium 4.2 3.5 - 5.1 mEq/L   Chloride 105 96 - 112 mEq/L   CO2 24 19 - 32 mEq/L   Glucose, Bld 126 (H) 70 - 99 mg/dL   BUN 19 6 - 23 mg/dL   Creatinine, Ser 0.92 0.40 - 1.50 mg/dL   Total Bilirubin 0.7 0.2 - 1.2 mg/dL   Alkaline Phosphatase 57 39 - 117 U/L   AST 15 0 - 37 U/L   ALT 16 0 - 53 U/L   Total Protein 7.0 6.0 - 8.3 g/dL   Albumin 4.0 3.5 - 5.2 g/dL   Calcium 9.3 8.4 - 10.5 mg/dL   GFR 87.06 >60.00 mL/min  Lipid panel  Result Value Ref Range   Cholesterol 225 (H) 0 - 200 mg/dL   Triglycerides 76.0 0.0 - 149.0 mg/dL   HDL 47.20 >39.00 mg/dL   VLDL 15.2 0.0 - 40.0 mg/dL   LDL Cholesterol 163 (H) 0 - 99 mg/dL   Total CHOL/HDL Ratio 5    NonHDL 178.15   Hemoglobin A1c  Result Value Ref Range   Hgb A1c MFr Bld 6.2 4.6 - 6.5 %   Message to pt Calculated estimated CV risk at 23% Will recommend a statin right away

## 2017-06-13 ENCOUNTER — Encounter: Payer: Self-pay | Admitting: Family Medicine

## 2017-06-13 ENCOUNTER — Ambulatory Visit (INDEPENDENT_AMBULATORY_CARE_PROVIDER_SITE_OTHER): Payer: PPO | Admitting: Family Medicine

## 2017-06-13 VITALS — BP 132/72 | HR 68 | Temp 98.2°F | Ht 69.0 in | Wt 254.9 lb

## 2017-06-13 DIAGNOSIS — Z23 Encounter for immunization: Secondary | ICD-10-CM

## 2017-06-13 DIAGNOSIS — R7309 Other abnormal glucose: Secondary | ICD-10-CM

## 2017-06-13 DIAGNOSIS — Z130 Encounter for screening for diseases of the blood and blood-forming organs and certain disorders involving the immune mechanism: Secondary | ICD-10-CM

## 2017-06-13 DIAGNOSIS — E785 Hyperlipidemia, unspecified: Secondary | ICD-10-CM | POA: Diagnosis not present

## 2017-06-13 DIAGNOSIS — Z13220 Encounter for screening for lipoid disorders: Secondary | ICD-10-CM

## 2017-06-13 DIAGNOSIS — C61 Malignant neoplasm of prostate: Secondary | ICD-10-CM

## 2017-06-13 DIAGNOSIS — Z0000 Encounter for general adult medical examination without abnormal findings: Secondary | ICD-10-CM | POA: Diagnosis not present

## 2017-06-13 DIAGNOSIS — H6123 Impacted cerumen, bilateral: Secondary | ICD-10-CM

## 2017-06-13 LAB — CBC
HCT: 42.4 % (ref 39.0–52.0)
Hemoglobin: 14.3 g/dL (ref 13.0–17.0)
MCHC: 33.7 g/dL (ref 30.0–36.0)
MCV: 99.4 fl (ref 78.0–100.0)
Platelets: 267 10*3/uL (ref 150.0–400.0)
RBC: 4.27 Mil/uL (ref 4.22–5.81)
RDW: 14.1 % (ref 11.5–15.5)
WBC: 4.1 10*3/uL (ref 4.0–10.5)

## 2017-06-13 LAB — COMPREHENSIVE METABOLIC PANEL
ALK PHOS: 57 U/L (ref 39–117)
ALT: 16 U/L (ref 0–53)
AST: 15 U/L (ref 0–37)
Albumin: 4 g/dL (ref 3.5–5.2)
BILIRUBIN TOTAL: 0.7 mg/dL (ref 0.2–1.2)
BUN: 19 mg/dL (ref 6–23)
CO2: 24 mEq/L (ref 19–32)
CREATININE: 0.92 mg/dL (ref 0.40–1.50)
Calcium: 9.3 mg/dL (ref 8.4–10.5)
Chloride: 105 mEq/L (ref 96–112)
GFR: 87.06 mL/min (ref >60.00)
GLUCOSE: 126 mg/dL — AB (ref 70–99)
Potassium: 4.2 mEq/L (ref 3.5–5.1)
SODIUM: 140 meq/L (ref 135–145)
TOTAL PROTEIN: 7 g/dL (ref 6.0–8.3)

## 2017-06-13 LAB — LIPID PANEL
Cholesterol: 225 mg/dL — ABNORMAL HIGH (ref 0–200)
HDL: 47.2 mg/dL (ref >39.00)
LDL Cholesterol: 163 mg/dL — ABNORMAL HIGH (ref 0–99)
NONHDL: 178.15
Total CHOL/HDL Ratio: 5
Triglycerides: 76 mg/dL (ref 0.0–149.0)
VLDL: 15.2 mg/dL (ref 0.0–40.0)

## 2017-06-13 LAB — HEMOGLOBIN A1C: HEMOGLOBIN A1C: 6.2 % (ref 4.6–6.5)

## 2017-06-13 NOTE — Patient Instructions (Addendum)
It was great to see you again today!  congrats on your new grand-daughter and on completing your prostate cancer treatment!    I will be in touch with your labs You got your 2nd (and last) pneumonia vaccine and your flu shot today I would recommend that you get the Shingrix shingles series at some point, but this is not an emergency  Take care and continue to stay active and try to eat well   Health Maintenance, Male A healthy lifestyle and preventive care is important for your health and wellness. Ask your health care provider about what schedule of regular examinations is right for you. What should I know about weight and diet? Eat a Healthy Diet  Eat plenty of vegetables, fruits, whole grains, low-fat dairy products, and lean protein.  Do not eat a lot of foods high in solid fats, added sugars, or salt.  Maintain a Healthy Weight Regular exercise can help you achieve or maintain a healthy weight. You should:  Do at least 150 minutes of exercise each week. The exercise should increase your heart rate and make you sweat (moderate-intensity exercise).  Do strength-training exercises at least twice a week.  Watch Your Levels of Cholesterol and Blood Lipids  Have your blood tested for lipids and cholesterol every 5 years starting at 67 years of age. If you are at high risk for heart disease, you should start having your blood tested when you are 67 years old. You may need to have your cholesterol levels checked more often if: ? Your lipid or cholesterol levels are high. ? You are older than 67 years of age. ? You are at high risk for heart disease.  What should I know about cancer screening? Many types of cancers can be detected early and may often be prevented. Lung Cancer  You should be screened every year for lung cancer if: ? You are a current smoker who has smoked for at least 30 years. ? You are a former smoker who has quit within the past 15 years.  Talk to your health  care provider about your screening options, when you should start screening, and how often you should be screened.  Colorectal Cancer  Routine colorectal cancer screening usually begins at 67 years of age and should be repeated every 5-10 years until you are 67 years old. You may need to be screened more often if early forms of precancerous polyps or small growths are found. Your health care provider may recommend screening at an earlier age if you have risk factors for colon cancer.  Your health care provider may recommend using home test kits to check for hidden blood in the stool.  A small camera at the end of a tube can be used to examine your colon (sigmoidoscopy or colonoscopy). This checks for the earliest forms of colorectal cancer.  Prostate and Testicular Cancer  Depending on your age and overall health, your health care provider may do certain tests to screen for prostate and testicular cancer.  Talk to your health care provider about any symptoms or concerns you have about testicular or prostate cancer.  Skin Cancer  Check your skin from head to toe regularly.  Tell your health care provider about any new moles or changes in moles, especially if: ? There is a change in a mole's size, shape, or color. ? You have a mole that is larger than a pencil eraser.  Always use sunscreen. Apply sunscreen liberally and repeat throughout the day.  Protect yourself by wearing long sleeves, pants, a wide-brimmed hat, and sunglasses when outside.  What should I know about heart disease, diabetes, and high blood pressure?  If you are 18-39 years of age, have your blood pressure checked every 3-5 years. If you are 40 years of age or older, have your blood pressure checked every year. You should have your blood pressure measured twice-once when you are at a hospital or clinic, and once when you are not at a hospital or clinic. Record the average of the two measurements. To check your blood  pressure when you are not at a hospital or clinic, you can use: ? An automated blood pressure machine at a pharmacy. ? A home blood pressure monitor.  Talk to your health care provider about your target blood pressure.  If you are between 45-79 years old, ask your health care provider if you should take aspirin to prevent heart disease.  Have regular diabetes screenings by checking your fasting blood sugar level. ? If you are at a normal weight and have a low risk for diabetes, have this test once every three years after the age of 45. ? If you are overweight and have a high risk for diabetes, consider being tested at a younger age or more often.  A one-time screening for abdominal aortic aneurysm (AAA) by ultrasound is recommended for men aged 65-75 years who are current or former smokers. What should I know about preventing infection? Hepatitis B If you have a higher risk for hepatitis B, you should be screened for this virus. Talk with your health care provider to find out if you are at risk for hepatitis B infection. Hepatitis C Blood testing is recommended for:  Everyone born from 1945 through 1965.  Anyone with known risk factors for hepatitis C.  Sexually Transmitted Diseases (STDs)  You should be screened each year for STDs including gonorrhea and chlamydia if: ? You are sexually active and are younger than 67 years of age. ? You are older than 67 years of age and your health care provider tells you that you are at risk for this type of infection. ? Your sexual activity has changed since you were last screened and you are at an increased risk for chlamydia or gonorrhea. Ask your health care provider if you are at risk.  Talk with your health care provider about whether you are at high risk of being infected with HIV. Your health care provider may recommend a prescription medicine to help prevent HIV infection.  What else can I do?  Schedule regular health, dental, and eye  exams.  Stay current with your vaccines (immunizations).  Do not use any tobacco products, such as cigarettes, chewing tobacco, and e-cigarettes. If you need help quitting, ask your health care provider.  Limit alcohol intake to no more than 2 drinks per day. One drink equals 12 ounces of beer, 5 ounces of wine, or 1 ounces of hard liquor.  Do not use street drugs.  Do not share needles.  Ask your health care provider for help if you need support or information about quitting drugs.  Tell your health care provider if you often feel depressed.  Tell your health care provider if you have ever been abused or do not feel safe at home. This information is not intended to replace advice given to you by your health care provider. Make sure you discuss any questions you have with your health care provider. Document Released: 03/18/2008 Document   Revised: 05/19/2016 Document Reviewed: 06/24/2015 Elsevier Interactive Patient Education  [employer].

## 2017-06-14 ENCOUNTER — Encounter: Payer: Self-pay | Admitting: Family Medicine

## 2017-06-14 DIAGNOSIS — R7303 Prediabetes: Secondary | ICD-10-CM

## 2017-06-14 DIAGNOSIS — E785 Hyperlipidemia, unspecified: Secondary | ICD-10-CM | POA: Insufficient documentation

## 2017-06-14 MED ORDER — LOVASTATIN 40 MG PO TABS: 40.0000 mg | ORAL_TABLET | Freq: Every day | ORAL | 3 refills | Status: DC

## 2017-06-14 MED ORDER — METFORMIN HCL 500 MG PO TABS: 500.0000 mg | ORAL_TABLET | Freq: Every day | ORAL | 3 refills | Status: DC

## 2017-06-14 MED FILL — LOVASTATIN 40 MG TAB: 40 | 90 days supply | Qty: 90 | Fill #0

## 2017-06-14 MED FILL — metFORMIN HCL 500 MG TABS: 500 | 90 days supply | Qty: 90 | Fill #0

## 2017-08-22 DIAGNOSIS — C61 Malignant neoplasm of prostate: Secondary | ICD-10-CM | POA: Diagnosis not present

## 2017-08-29 DIAGNOSIS — C61 Malignant neoplasm of prostate: Secondary | ICD-10-CM | POA: Diagnosis not present

## 2017-09-15 MED FILL — metFORMIN HCL 500 MG TABS: 500 | 90 days supply | Qty: 90 | Fill #1

## 2017-09-15 MED FILL — LOVASTATIN 40 MG TAB: 40 | 90 days supply | Qty: 90 | Fill #1

## 2017-11-01 DIAGNOSIS — H5203 Hypermetropia, bilateral: Secondary | ICD-10-CM | POA: Diagnosis not present

## 2017-11-01 DIAGNOSIS — H2512 Age-related nuclear cataract, left eye: Secondary | ICD-10-CM | POA: Diagnosis not present

## 2017-11-01 DIAGNOSIS — H52223 Regular astigmatism, bilateral: Secondary | ICD-10-CM | POA: Diagnosis not present

## 2017-11-01 DIAGNOSIS — H2511 Age-related nuclear cataract, right eye: Secondary | ICD-10-CM | POA: Diagnosis not present

## 2017-11-09 DIAGNOSIS — Z0100 Encounter for examination of eyes and vision without abnormal findings: Secondary | ICD-10-CM | POA: Diagnosis not present

## 2017-12-15 ENCOUNTER — Ambulatory Visit: Payer: Self-pay | Admitting: Family Medicine

## 2017-12-15 ENCOUNTER — Ambulatory Visit: Payer: PPO | Admitting: *Deleted

## 2017-12-26 DIAGNOSIS — R69 Illness, unspecified: Secondary | ICD-10-CM | POA: Diagnosis not present

## 2018-01-02 MED FILL — metFORMIN HCL 500 MG TABS: 500 | 90 days supply | Qty: 90 | Fill #2 | Status: TO

## 2018-01-02 MED FILL — LOVASTATIN 40 MG TABS: 40 | 90 days supply | Qty: 90 | Fill #2 | Status: TO

## 2018-02-13 ENCOUNTER — Ambulatory Visit: Payer: Self-pay | Admitting: Family Medicine

## 2018-02-15 DIAGNOSIS — C61 Malignant neoplasm of prostate: Secondary | ICD-10-CM | POA: Diagnosis not present

## 2018-02-15 DIAGNOSIS — Z192 Hormone resistant malignancy status: Secondary | ICD-10-CM | POA: Diagnosis not present

## 2018-02-20 ENCOUNTER — Ambulatory Visit: Payer: PPO | Admitting: Family Medicine

## 2018-02-22 DIAGNOSIS — R319 Hematuria, unspecified: Secondary | ICD-10-CM | POA: Diagnosis not present

## 2018-02-22 DIAGNOSIS — R310 Gross hematuria: Secondary | ICD-10-CM | POA: Diagnosis not present

## 2018-02-22 DIAGNOSIS — C61 Malignant neoplasm of prostate: Secondary | ICD-10-CM | POA: Diagnosis not present

## 2018-03-04 DIAGNOSIS — Z87442 Personal history of urinary calculi: Secondary | ICD-10-CM

## 2018-03-04 HISTORY — DX: Personal history of urinary calculi: Z87.442

## 2018-03-10 DIAGNOSIS — N2889 Other specified disorders of kidney and ureter: Secondary | ICD-10-CM | POA: Diagnosis not present

## 2018-03-10 DIAGNOSIS — N201 Calculus of ureter: Secondary | ICD-10-CM | POA: Diagnosis not present

## 2018-03-10 DIAGNOSIS — R8290 Unspecified abnormal findings in urine: Secondary | ICD-10-CM | POA: Diagnosis not present

## 2018-03-10 DIAGNOSIS — C61 Malignant neoplasm of prostate: Secondary | ICD-10-CM | POA: Diagnosis not present

## 2018-03-10 DIAGNOSIS — E119 Type 2 diabetes mellitus without complications: Secondary | ICD-10-CM | POA: Diagnosis not present

## 2018-03-10 DIAGNOSIS — R319 Hematuria, unspecified: Secondary | ICD-10-CM | POA: Diagnosis not present

## 2018-03-10 DIAGNOSIS — R109 Unspecified abdominal pain: Secondary | ICD-10-CM | POA: Diagnosis not present

## 2018-03-10 DIAGNOSIS — K8020 Calculus of gallbladder without cholecystitis without obstruction: Secondary | ICD-10-CM | POA: Diagnosis not present

## 2018-03-10 DIAGNOSIS — N135 Crossing vessel and stricture of ureter without hydronephrosis: Secondary | ICD-10-CM | POA: Diagnosis not present

## 2018-03-10 DIAGNOSIS — K429 Umbilical hernia without obstruction or gangrene: Secondary | ICD-10-CM | POA: Diagnosis not present

## 2018-03-10 DIAGNOSIS — N131 Hydronephrosis with ureteral stricture, not elsewhere classified: Secondary | ICD-10-CM | POA: Diagnosis not present

## 2018-03-10 DIAGNOSIS — Z8546 Personal history of malignant neoplasm of prostate: Secondary | ICD-10-CM | POA: Diagnosis not present

## 2018-03-13 DIAGNOSIS — N200 Calculus of kidney: Secondary | ICD-10-CM | POA: Diagnosis not present

## 2018-03-23 ENCOUNTER — Ambulatory Visit: Payer: PPO | Admitting: Family Medicine

## 2018-04-10 DIAGNOSIS — N135 Crossing vessel and stricture of ureter without hydronephrosis: Secondary | ICD-10-CM | POA: Diagnosis not present

## 2018-04-10 DIAGNOSIS — C61 Malignant neoplasm of prostate: Secondary | ICD-10-CM | POA: Diagnosis not present

## 2018-04-10 DIAGNOSIS — N201 Calculus of ureter: Secondary | ICD-10-CM | POA: Diagnosis not present

## 2018-04-10 DIAGNOSIS — N200 Calculus of kidney: Secondary | ICD-10-CM | POA: Diagnosis not present

## 2018-04-21 NOTE — Progress Notes (Addendum)
[place] [facility] [address], [place], [place] [address] [phone]  Date:  04/24/2018   Name:  Jason Obrien   DOB:  1950/10/28   MRN:  [id number]  PCP:  Darreld Mclean, MD    Chief Complaint: Prediabetes (follow up from physical, lab work)   History of Present Illness:  Jason Obrien is a 67 y.o. very pleasant male patient who presents with the following:  Following up from our visit about a year ago- last September:  Here today for a physical exam and wellness visit History of prostate cancer, pre- diabetes, and overweight Due for flu shot and pneumovax today Lat visit with me in October of last year:  He is here today as a new patient- he was a [place] pt in the past but does not really seek medical care.  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  No recent BW- he is fasting today so we can draw blood.   He is married, his 2 children are 57 and 43 yo, doing well.  He had a prostatectomy in December- he had a positive margin and underwent radiation as well. He did 37 treatments and finished just about a month ago His urologist is Dr. Nevada Crane They will repeat a PSA in about 3 months His bowel and bladder function changed a bit during his radiation- now improving however.   [facility] just bought his urology office from [facility]- he is not sure if this will bring any changes to him No chemo planned for him  His grandson is tuning 2 next month, and now he has a Geographical information systems officer as well  Per last lab notes: Your blood count is normal  Metabolic profile is ok except for your blood sugar, which is high for a fasting level  Your A1c (average blood sugar over 3 months) is in the pre-diabetes range still.   I am concerned about your cholesterol-it looks significantly worse than last year, and gives you a higher risk of  developing cardiovascular disease. If you agree, I would like to start you on a cholesterol medication to try and bring your LDL down/ HDL up. Let me know if this would be ok with you, and I will send in an rx!   As far as your blood sugar, we could also consider starting you on an oral medication called metformin to help control your sugar and prevent onset of diabetes. What are your thoughts  He reports that he had kidney stones a few weeks ago- his urologist is Dr. Nevada Crane and he helped him with this- they did a cystoscopy and removed bilateral stones and placed stents, these were removed 2 weeks ago   Pt elected to start on metformin and statin both - he is tolerating these well No muscle pains noted  He may have some right hip stiffness in the am but this gets better as he gets moving  He is trying to walk most day in the early am for exercise His nearly 56 yo grand-son is doing well, and his grand-daughter just turned 1   Wt Readings from Last 3 Encounters:  04/24/18 263 lb (119.3 kg)  06/13/17 254 lb 14.4 oz (115.6 kg)  08/16/16 251 lb (113.9 kg)   Lab Results  Component Value Date   HGBA1C  6.2 06/13/2017   He is fasting this am so far   He reports that his PSA has been undetectable since his surgery  He did have a kidney stone a few years ago treated at the ER here.  No CP or SOB   Patient Active Problem List   Diagnosis Date Noted  . Dyslipidemia 06/14/2017  . Prostate cancer ([place]) 07/21/2016  . Pre-diabetes 07/21/2016  . Nephrolithiasis 07/07/2016  . Elevated PSA 05/31/2016  . Overweight 05/20/2016    Past Medical History:  Diagnosis Date  . Cancer [facility])    Prostate Cancer for SX in Dec.2017  . Chicken pox    childhood  . Kidney stones    In 2013 or 2014    Past Surgical History:  Procedure Laterality Date  . COLONOSCOPY  01/30/2003  . OTHER SURGICAL HISTORY     Right arm surgery for compound fracture   . robotic assisted lap radical prostatectomy N/A  09/14/2016   prostate cancer- done pe [facility], Dr. Nevada Crane    Social History   Tobacco Use  . Smoking status: Never Smoker  . Smokeless tobacco: Never Used  Substance Use Topics  . Alcohol use: Yes    Alcohol/week: 1.8 oz    Types: 3 Shots of liquor per week    Comment: Social only   . Drug use: No    Family History  Problem Relation Age of Onset  . Hypertension Father   . Heart failure Father        died age 79  . Colon cancer Paternal Grandmother     No Known Allergies  Medication list has been reviewed and updated.  Current Outpatient Medications on File Prior to Visit  Medication Sig Dispense Refill  . aspirin (BAYER ASPIRIN EC LOW DOSE) 81 MG EC tablet Take 81 mg by mouth daily. Swallow whole.    . Glucosamine-Chondroit-Vit C-Mn (GLUCOSAMINE 1500 COMPLEX) CAPS Take 1 capsule by mouth daily.    Marland Kitchen lovastatin (MEVACOR) 40 MG tablet Take 1 tablet (40 mg total) by mouth daily. 90 tablet 3  . metFORMIN (GLUCOPHAGE) 500 MG tablet Take 1 tablet (500 mg total) by mouth daily. 90 tablet 3  . Multiple Vitamins-Minerals (ONE-A-DAY MENS 50+ ADVANTAGE PO) Take 1 capsule by mouth daily.    . Omega-3 Fatty Acids (FISH OIL ULTRA) 1400 MG CAPS Take 1 capsule by mouth daily.     No current facility-administered medications on file prior to visit.     Review of Systems:  As per HPI- otherwise negative.   Physical Examination: Vitals:   04/24/18 0833  BP: 138/84  Pulse: 86  Resp: 16  Temp: 98.1 F (36.7 C)  SpO2: 95%   Vitals:   04/24/18 0833  Weight: 263 lb (119.3 kg)  Height: 5\' 9"  (1.753 m)   Body mass index is 38.84 kg/m. Ideal Body Weight: Weight in (lb) to have BMI = 25: 168.9  GEN: WDWN, NAD, Non-toxic, A & O x 3, obese, looks well  HEENT: Atraumatic, Normocephalic. Neck supple. No masses, No LAD.  Bilateral TM wnl, oropharynx normal.  PEERL,EOMI.   Ears and Nose: No external deformity. CV: RRR, No M/G/R. No JVD. No thrill. No extra heart sounds. PULM: CTA B, no  wheezes, crackles, rhonchi. No retractions. No resp. distress. No accessory muscle use. EXTR: No c/c/e NEURO Normal gait.  PSYCH: Normally interactive. Conversant. Not depressed or anxious appearing.  Calm demeanor.    Assessment and Plan: Pre-diabetes - Plan: Comprehensive metabolic panel, Hemoglobin  A1c  Dyslipidemia - Plan: CBC, Comprehensive metabolic panel, Lipid panel  Prostate cancer ([place]) - Plan: CBC  Weight gain  Here today to follow-up Will check on his A1c and lipids today as he is now on medications for these He is continuing to see urology for his prostate cancer and also recent kidney stones Discussed his weight- he has gained about 10 lbs since our last visit Encouraged him to work on diet with a goal of losing 1 lb a week. He is doing a good job with exercise already  Plan to visit in about 6 months   Signed Lamar Blinks, MD  Received his labs, message to pt  Cholesterol is quite good, much improved! - continue lovastatin A1c is better, continue metformin Your white blood cell count is just minimally low today  Likely of no concern, but we will repeat when we visit in 6 months Metabolic profile looks fine Take care!  Let's meet in 6 months for a routine visit  Results for orders placed or performed in visit on 04/24/18  CBC  Result Value Ref Range   WBC 3.5 (L) 4.0 - 10.5 K/uL   RBC 3.88 (L) 4.22 - 5.81 Mil/uL   Platelets 234.0 150.0 - 400.0 K/uL   Hemoglobin 13.1 13.0 - 17.0 g/dL   HCT 38.3 (L) 39.0 - 52.0 %   MCV 98.8 78.0 - 100.0 fl   MCHC 34.1 30.0 - 36.0 g/dL   RDW 13.5 11.5 - 15.5 %  Comprehensive metabolic panel  Result Value Ref Range   Sodium 139 135 - 145 mEq/L   Potassium 4.3 3.5 - 5.1 mEq/L   Chloride 104 96 - 112 mEq/L   CO2 28 19 - 32 mEq/L   Glucose, Bld 129 (H) 70 - 99 mg/dL   BUN 19 6 - 23 mg/dL   Creatinine, Ser 0.99 0.40 - 1.50 mg/dL   Total Bilirubin 0.7 0.2 - 1.2 mg/dL   Alkaline Phosphatase 54 39 - 117 U/L   AST 14 0 - 37  U/L   ALT 18 0 - 53 U/L   Total Protein 6.7 6.0 - 8.3 g/dL   Albumin 4.1 3.5 - 5.2 g/dL   Calcium 9.1 8.4 - 10.5 mg/dL   GFR 79.79 >60.00 mL/min  Hemoglobin A1c  Result Value Ref Range   Hgb A1c MFr Bld 6.1 4.6 - 6.5 %  Lipid panel  Result Value Ref Range   Cholesterol 164 0 - 200 mg/dL   Triglycerides 70.0 0.0 - 149.0 mg/dL   HDL 56.00 >39.00 mg/dL   VLDL 14.0 0.0 - 40.0 mg/dL   LDL Cholesterol 94 0 - 99 mg/dL   Total CHOL/HDL Ratio 3    NonHDL 108.15

## 2018-04-24 ENCOUNTER — Encounter: Payer: Self-pay | Admitting: Family Medicine

## 2018-04-24 ENCOUNTER — Ambulatory Visit (INDEPENDENT_AMBULATORY_CARE_PROVIDER_SITE_OTHER): Payer: Medicare HMO | Admitting: Family Medicine

## 2018-04-24 VITALS — BP 138/84 | HR 86 | Temp 98.1°F | Resp 16 | Ht 69.0 in | Wt 263.0 lb

## 2018-04-24 DIAGNOSIS — E785 Hyperlipidemia, unspecified: Secondary | ICD-10-CM

## 2018-04-24 DIAGNOSIS — C61 Malignant neoplasm of prostate: Secondary | ICD-10-CM | POA: Diagnosis not present

## 2018-04-24 DIAGNOSIS — R635 Abnormal weight gain: Secondary | ICD-10-CM | POA: Diagnosis not present

## 2018-04-24 DIAGNOSIS — R7303 Prediabetes: Secondary | ICD-10-CM

## 2018-04-24 LAB — COMPREHENSIVE METABOLIC PANEL
ALK PHOS: 54 U/L (ref 39–117)
ALT: 18 U/L (ref 0–53)
AST: 14 U/L (ref 0–37)
Albumin: 4.1 g/dL (ref 3.5–5.2)
BILIRUBIN TOTAL: 0.7 mg/dL (ref 0.2–1.2)
BUN: 19 mg/dL (ref 6–23)
CALCIUM: 9.1 mg/dL (ref 8.4–10.5)
CO2: 28 meq/L (ref 19–32)
CREATININE: 0.99 mg/dL (ref 0.40–1.50)
Chloride: 104 mEq/L (ref 96–112)
GFR: 79.79 mL/min (ref >60.00)
Glucose, Bld: 129 mg/dL — ABNORMAL HIGH (ref 70–99)
Potassium: 4.3 mEq/L (ref 3.5–5.1)
Sodium: 139 mEq/L (ref 135–145)
TOTAL PROTEIN: 6.7 g/dL (ref 6.0–8.3)

## 2018-04-24 LAB — LIPID PANEL
CHOL/HDL RATIO: 3
CHOLESTEROL: 164 mg/dL (ref 0–200)
HDL: 56 mg/dL (ref >39.00)
LDL CALC: 94 mg/dL (ref 0–99)
NonHDL: 108.15
Triglycerides: 70 mg/dL (ref 0.0–149.0)
VLDL: 14 mg/dL (ref 0.0–40.0)

## 2018-04-24 LAB — CBC
HCT: 38.3 % — ABNORMAL LOW (ref 39.0–52.0)
Hemoglobin: 13.1 g/dL (ref 13.0–17.0)
MCHC: 34.1 g/dL (ref 30.0–36.0)
MCV: 98.8 fl (ref 78.0–100.0)
Platelets: 234 10*3/uL (ref 150.0–400.0)
RBC: 3.88 Mil/uL — ABNORMAL LOW (ref 4.22–5.81)
RDW: 13.5 % (ref 11.5–15.5)
WBC: 3.5 10*3/uL — AB (ref 4.0–10.5)

## 2018-04-24 LAB — HEMOGLOBIN A1C: Hgb A1c MFr Bld: 6.1 % (ref 4.6–6.5)

## 2018-04-24 NOTE — Patient Instructions (Signed)
You might want to have shingrix series done for shingles  I will be in touch with your labs asap- we will see how your metformin and cholesterol med are working Good job with exercise Please do work on getting your weight down- 1 lb a week is a good rate!    Please see me in about 6 months for a physical and take care!

## 2018-05-08 DIAGNOSIS — N200 Calculus of kidney: Secondary | ICD-10-CM | POA: Diagnosis not present

## 2018-05-08 DIAGNOSIS — N1330 Unspecified hydronephrosis: Secondary | ICD-10-CM | POA: Diagnosis not present

## 2018-05-08 DIAGNOSIS — N135 Crossing vessel and stricture of ureter without hydronephrosis: Secondary | ICD-10-CM | POA: Diagnosis not present

## 2018-05-15 DIAGNOSIS — C61 Malignant neoplasm of prostate: Secondary | ICD-10-CM | POA: Diagnosis not present

## 2018-05-15 DIAGNOSIS — N201 Calculus of ureter: Secondary | ICD-10-CM | POA: Diagnosis not present

## 2018-05-15 DIAGNOSIS — N135 Crossing vessel and stricture of ureter without hydronephrosis: Secondary | ICD-10-CM | POA: Diagnosis not present

## 2018-05-15 DIAGNOSIS — N200 Calculus of kidney: Secondary | ICD-10-CM | POA: Diagnosis not present

## 2018-07-21 ENCOUNTER — Other Ambulatory Visit: Payer: Self-pay | Admitting: Family Medicine

## 2018-07-21 DIAGNOSIS — E785 Hyperlipidemia, unspecified: Secondary | ICD-10-CM

## 2018-07-21 DIAGNOSIS — R7303 Prediabetes: Secondary | ICD-10-CM

## 2018-07-25 DIAGNOSIS — R69 Illness, unspecified: Secondary | ICD-10-CM | POA: Diagnosis not present

## 2018-08-14 DIAGNOSIS — C61 Malignant neoplasm of prostate: Secondary | ICD-10-CM | POA: Diagnosis not present

## 2018-08-18 DIAGNOSIS — N1330 Unspecified hydronephrosis: Secondary | ICD-10-CM | POA: Diagnosis not present

## 2018-08-18 DIAGNOSIS — N1339 Other hydronephrosis: Secondary | ICD-10-CM | POA: Diagnosis not present

## 2018-08-21 DIAGNOSIS — C61 Malignant neoplasm of prostate: Secondary | ICD-10-CM | POA: Diagnosis not present

## 2018-08-21 DIAGNOSIS — N135 Crossing vessel and stricture of ureter without hydronephrosis: Secondary | ICD-10-CM | POA: Diagnosis not present

## 2018-08-21 DIAGNOSIS — Z192 Hormone resistant malignancy status: Secondary | ICD-10-CM | POA: Diagnosis not present

## 2018-08-21 DIAGNOSIS — N200 Calculus of kidney: Secondary | ICD-10-CM | POA: Diagnosis not present

## 2018-09-05 DIAGNOSIS — N135 Crossing vessel and stricture of ureter without hydronephrosis: Secondary | ICD-10-CM | POA: Diagnosis not present

## 2018-09-05 DIAGNOSIS — N1330 Unspecified hydronephrosis: Secondary | ICD-10-CM | POA: Diagnosis not present

## 2018-09-11 ENCOUNTER — Ambulatory Visit (INDEPENDENT_AMBULATORY_CARE_PROVIDER_SITE_OTHER): Payer: Medicare HMO

## 2018-09-11 DIAGNOSIS — Z23 Encounter for immunization: Secondary | ICD-10-CM

## 2018-09-13 DIAGNOSIS — C61 Malignant neoplasm of prostate: Secondary | ICD-10-CM | POA: Diagnosis not present

## 2018-09-13 DIAGNOSIS — N135 Crossing vessel and stricture of ureter without hydronephrosis: Secondary | ICD-10-CM | POA: Diagnosis not present

## 2018-10-30 ENCOUNTER — Encounter: Payer: Medicare HMO | Admitting: Family Medicine

## 2018-11-27 ENCOUNTER — Encounter: Payer: Medicare HMO | Admitting: Family Medicine

## 2018-12-25 ENCOUNTER — Encounter: Payer: Medicare HMO | Admitting: Family Medicine

## 2019-01-14 ENCOUNTER — Other Ambulatory Visit: Payer: Self-pay | Admitting: Family Medicine

## 2019-01-14 DIAGNOSIS — E785 Hyperlipidemia, unspecified: Secondary | ICD-10-CM

## 2019-01-14 DIAGNOSIS — R7303 Prediabetes: Secondary | ICD-10-CM

## 2019-01-19 ENCOUNTER — Other Ambulatory Visit: Payer: Self-pay

## 2019-01-19 ENCOUNTER — Ambulatory Visit (INDEPENDENT_AMBULATORY_CARE_PROVIDER_SITE_OTHER): Payer: Medicare HMO

## 2019-01-19 DIAGNOSIS — Z23 Encounter for immunization: Secondary | ICD-10-CM | POA: Diagnosis not present

## 2019-01-25 ENCOUNTER — Encounter: Payer: Medicare HMO | Admitting: Family Medicine

## 2019-02-13 DIAGNOSIS — C61 Malignant neoplasm of prostate: Secondary | ICD-10-CM | POA: Diagnosis not present

## 2019-02-13 DIAGNOSIS — N131 Hydronephrosis with ureteral stricture, not elsewhere classified: Secondary | ICD-10-CM | POA: Diagnosis not present

## 2019-02-13 DIAGNOSIS — N135 Crossing vessel and stricture of ureter without hydronephrosis: Secondary | ICD-10-CM | POA: Diagnosis not present

## 2019-03-27 NOTE — Progress Notes (Addendum)
[place] at [facility] [address] [place], [place] [address] [phone]  Date:  03/29/2019   Name:  Jason Obrien   DOB:  1951/01/22   MRN:  [id number]  PCP:  Darreld Mclean, MD    Chief Complaint: Follow-up and Immunizations (shingrix at pharmacy)   History of Present Illness:  Jason Obrien is a 68 y.o. very pleasant male patient who presents with the following:  Here today for a physical exam- last seen by myself about one year ago  History of dyslipidemia, prostate cancer, pre-diabetes His urologist is Dr Nevada Crane, last visit with him in December.  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   His grandson is now 38 and granddaughter is 8 yo- they live in [place]    He is overall feeling good He is walking 2 miles most days of the week no sx such as CP or SOB He is trying to ride his bike   Never a smoker   Wt Readings from Last 3 Encounters:  03/29/19 262 lb (118.8 kg)  04/24/18 263 lb (119.3 kg)  06/13/17 254 lb 14.4 oz (115.6 kg)     Patient Active Problem List   Diagnosis Date Noted  . Dyslipidemia 06/14/2017  . Prostate cancer ([place]) 07/21/2016  . Pre-diabetes 07/21/2016  . Nephrolithiasis 07/07/2016  . Elevated PSA 05/31/2016  . Overweight 05/20/2016    Past Medical History:  Diagnosis Date  . Cancer [facility])    Prostate Cancer for SX in Dec.2017  . Chicken pox    childhood  . Kidney stones    In 2013 or 2014    Past Surgical History:  Procedure Laterality Date  . COLONOSCOPY  01/30/2003  . OTHER SURGICAL HISTORY     Right arm surgery for compound fracture   . robotic assisted lap  radical prostatectomy N/A 09/14/2016   prostate cancer- done pe [facility], Dr. Nevada Crane    Social History   Tobacco Use  . Smoking status: Never Smoker  . Smokeless tobacco: Never Used  Substance Use Topics  . Alcohol use: Yes    Alcohol/week: 3.0 standard drinks    Types: 3 Shots of liquor per week    Comment: Social only   . Drug use: No    Family History  Problem Relation Age of Onset  . Hypertension Father   . Heart failure Father        died age 59  . Colon cancer Paternal Grandmother     No Known Allergies  Medication list has been reviewed and updated.  Current Outpatient Medications on File Prior to Visit  Medication Sig Dispense Refill  . aspirin (BAYER ASPIRIN EC LOW DOSE) 81 MG EC tablet Take 81 mg by mouth daily. Swallow whole.    . Glucosamine-Chondroit-Vit C-Mn (GLUCOSAMINE 1500 COMPLEX) CAPS Take 1 capsule by mouth daily.    . Multiple Vitamins-Minerals (ONE-A-DAY MENS 50+ ADVANTAGE PO) Take 1 capsule by mouth daily.    . Omega-3 Fatty Acids (FISH OIL ULTRA) 1400 MG CAPS Take 1 capsule by mouth daily.     No current  facility-administered medications on file prior to visit.     Review of Systems:  As per HPI- otherwise negative. No skin changes No digestion changes   Physical Examination: Vitals:   03/29/19 0851  BP: (!) 142/88  Pulse: 67  Temp: 99 F (37.2 C)  SpO2: 98%   Vitals:   03/29/19 0851  Weight: 262 lb (118.8 kg)   Body mass index is 38.69 kg/m. Ideal Body Weight:    GEN: WDWN, NAD, Non-toxic, A & O x 3, obese, looks well otherwise  HEENT: Atraumatic, Normocephalic. Neck supple. No masses, No LAD.  TM wnl, PEERL Ears and Nose: No external deformity. CV: RRR, No M/G/R. No JVD. No thrill. No extra heart sounds. PULM: CTA B, no wheezes, crackles, rhonchi. No retractions. No resp. distress. No accessory muscle use. ABD: S, NT, ND. No rebound. No HSM. EXTR: No c/c/e NEURO Normal gait.  PSYCH: Normally interactive. Conversant. Not  depressed or anxious appearing.  Calm demeanor.   Wt Readings from Last 3 Encounters:  03/29/19 262 lb (118.8 kg)  04/24/18 263 lb (119.3 kg)  06/13/17 254 lb 14.4 oz (115.6 kg)   BP Readings from Last 3 Encounters:  03/29/19 (!) 142/88  04/24/18 138/84  06/13/17 132/72    Assessment and Plan:   ICD-10-CM   1. Physical exam  Z00.00   2. Need for shingles vaccine  Z23   3. Pre-diabetes  R73.03 Hemoglobin A1c    metFORMIN (GLUCOPHAGE) 500 MG tablet  4. Dyslipidemia  E78.5 Comprehensive metabolic panel    lovastatin (MEVACOR) 40 MG tablet  5. Prostate cancer ([place])  C61   6. Screening for deficiency anemia  Z13.0 CBC  7. Screening for hyperlipidemia  Z13.220 Lipid panel     Follow-up: No follow-ups on file.  Meds ordered this encounter  Medications  . lovastatin (MEVACOR) 40 MG tablet    Sig: Take 1 tablet (40 mg total) by mouth daily.    Dispense:  90 tablet    Refill:  3  . metFORMIN (GLUCOPHAGE) 500 MG tablet    Sig: Take 1 tablet (500 mg total) by mouth daily.    Dispense:  90 tablet    Refill:  3   Orders Placed This Encounter  Procedures  . CBC  . Comprehensive metabolic panel  . Hemoglobin A1c  . Lipid panel    CPE today Doing well, only concern is his weight Encouraged gradual weight loss through diet and exercise BP is slightly elevated- again encouraged weight loss  Continue metformin and lovastatin   Will plan further follow- up pending labs.   Signed Lamar Blinks, MD  Received his labs 6/26-  Message to pt    Cholesterol is excellent Your A1c is in the pre-diabetes range- stable.  Continue lovastatin and metformin Metabolic profile looks fine Your red cell counts are normal. Your white cell count is just slightly low- same as last year.  It seem that your white cell counts run on the lower side of normal always- this may be just personal variation.  However I would like to recheck this in 3-6 months instead of waiting a whole year, to make  sure your cell count does not decrease further.    Please see me in about 6 months for a recheck visit.  We can check your blood counts  then or I can order as lab only if you prefer to do this earlier Results for orders placed or performed in visit on 03/29/19  CBC  Result Value Ref Range   WBC 3.3 (L) 4.0 - 10.5 K/uL   RBC 4.19 (L) 4.22 - 5.81 Mil/uL   Platelets 238.0 150.0 - 400.0 K/uL   Hemoglobin 14.2 13.0 - 17.0 g/dL   HCT 41.7 39.0 - 52.0 %   MCV 99.4 78.0 - 100.0 fl   MCHC 34.0 30.0 - 36.0 g/dL   RDW 13.7 11.5 - 15.5 %  Comprehensive metabolic panel  Result Value Ref Range   Sodium 141 135 - 145 mEq/L   Potassium 4.4 3.5 - 5.1 mEq/L   Chloride 106 96 - 112 mEq/L   CO2 27 19 - 32 mEq/L   Glucose, Bld 129 (H) 70 - 99 mg/dL   BUN 15 6 - 23 mg/dL   Creatinine, Ser 0.83 0.40 - 1.50 mg/dL   Total Bilirubin 0.9 0.2 - 1.2 mg/dL   Alkaline Phosphatase 58 39 - 117 U/L   AST 15 0 - 37 U/L   ALT 16 0 - 53 U/L   Total Protein 6.6 6.0 - 8.3 g/dL   Albumin 4.1 3.5 - 5.2 g/dL   Calcium 8.7 8.4 - 10.5 mg/dL   GFR 91.76 >60.00 mL/min  Hemoglobin A1c  Result Value Ref Range   Hgb A1c MFr Bld 6.1 4.6 - 6.5 %  Lipid panel  Result Value Ref Range   Cholesterol 139 0 - 200 mg/dL   Triglycerides 77.0 0.0 - 149.0 mg/dL   HDL 56.50 >39.00 mg/dL   VLDL 15.4 0.0 - 40.0 mg/dL   LDL Cholesterol 67 0 - 99 mg/dL   Total CHOL/HDL Ratio 2    NonHDL 82.16

## 2019-03-29 ENCOUNTER — Encounter: Payer: Self-pay | Admitting: Family Medicine

## 2019-03-29 ENCOUNTER — Other Ambulatory Visit: Payer: Self-pay

## 2019-03-29 ENCOUNTER — Ambulatory Visit (INDEPENDENT_AMBULATORY_CARE_PROVIDER_SITE_OTHER): Payer: Medicare HMO | Admitting: Family Medicine

## 2019-03-29 VITALS — BP 142/88 | HR 67 | Temp 99.0°F | Wt 262.0 lb

## 2019-03-29 DIAGNOSIS — Z23 Encounter for immunization: Secondary | ICD-10-CM | POA: Diagnosis not present

## 2019-03-29 DIAGNOSIS — Z130 Encounter for screening for diseases of the blood and blood-forming organs and certain disorders involving the immune mechanism: Secondary | ICD-10-CM | POA: Diagnosis not present

## 2019-03-29 DIAGNOSIS — R7303 Prediabetes: Secondary | ICD-10-CM | POA: Diagnosis not present

## 2019-03-29 DIAGNOSIS — C61 Malignant neoplasm of prostate: Secondary | ICD-10-CM | POA: Diagnosis not present

## 2019-03-29 DIAGNOSIS — E785 Hyperlipidemia, unspecified: Secondary | ICD-10-CM | POA: Diagnosis not present

## 2019-03-29 DIAGNOSIS — Z0000 Encounter for general adult medical examination without abnormal findings: Secondary | ICD-10-CM | POA: Diagnosis not present

## 2019-03-29 DIAGNOSIS — Z13220 Encounter for screening for lipoid disorders: Secondary | ICD-10-CM

## 2019-03-29 LAB — LIPID PANEL
Cholesterol: 139 mg/dL (ref 0–200)
HDL: 56.5 mg/dL (ref >39.00)
LDL Cholesterol: 67 mg/dL (ref 0–99)
NonHDL: 82.16
Total CHOL/HDL Ratio: 2
Triglycerides: 77 mg/dL (ref 0.0–149.0)
VLDL: 15.4 mg/dL (ref 0.0–40.0)

## 2019-03-29 LAB — HEMOGLOBIN A1C: Hgb A1c MFr Bld: 6.1 % (ref 4.6–6.5)

## 2019-03-29 LAB — COMPREHENSIVE METABOLIC PANEL
ALT: 16 U/L (ref 0–53)
AST: 15 U/L (ref 0–37)
Albumin: 4.1 g/dL (ref 3.5–5.2)
Alkaline Phosphatase: 58 U/L (ref 39–117)
BUN: 15 mg/dL (ref 6–23)
CO2: 27 mEq/L (ref 19–32)
Calcium: 8.7 mg/dL (ref 8.4–10.5)
Chloride: 106 mEq/L (ref 96–112)
Creatinine, Ser: 0.83 mg/dL (ref 0.40–1.50)
GFR: 91.76 mL/min (ref >60.00)
Glucose, Bld: 129 mg/dL — ABNORMAL HIGH (ref 70–99)
Potassium: 4.4 mEq/L (ref 3.5–5.1)
Sodium: 141 mEq/L (ref 135–145)
Total Bilirubin: 0.9 mg/dL (ref 0.2–1.2)
Total Protein: 6.6 g/dL (ref 6.0–8.3)

## 2019-03-29 LAB — CBC
HCT: 41.7 % (ref 39.0–52.0)
Hemoglobin: 14.2 g/dL (ref 13.0–17.0)
MCHC: 34 g/dL (ref 30.0–36.0)
MCV: 99.4 fl (ref 78.0–100.0)
Platelets: 238 10*3/uL (ref 150.0–400.0)
RBC: 4.19 Mil/uL — ABNORMAL LOW (ref 4.22–5.81)
RDW: 13.7 % (ref 11.5–15.5)
WBC: 3.3 10*3/uL — ABNORMAL LOW (ref 4.0–10.5)

## 2019-03-29 MED ORDER — METFORMIN HCL 500 MG PO TABS: 500.0000 mg | ORAL_TABLET | Freq: Every day | ORAL | 3 refills | Status: DC

## 2019-03-29 MED ORDER — LOVASTATIN 40 MG PO TABS: 40.0000 mg | ORAL_TABLET | Freq: Every day | ORAL | 3 refills | Status: DC

## 2019-03-29 NOTE — Patient Instructions (Signed)
Great to see you today!  Stay well, I will be in touch with your labs asap  Great job with exercise- keep it up  Your wight is stable, which is good news However, do try and work on gradual weight loss.  Even 10- 15 lbs would be a good step for you!   Please get your 2nd shingles vaccine at your drug store   Health Maintenance After Age 69 After age 82, you are at a higher risk for certain long-term diseases and infections as well as injuries from falls. Falls are a major cause of broken bones and head injuries in people who are older than age 48. Getting regular preventive care can help to keep you healthy and well. Preventive care includes getting regular testing and making lifestyle changes as recommended by your health care provider. Talk with your health care provider about:  Which screenings and tests you should have. A screening is a test that checks for a disease when you have no symptoms.  A diet and exercise plan that is right for you. What should I know about screenings and tests to prevent falls? Screening and testing are the best ways to find a health problem early. Early diagnosis and treatment give you the best chance of managing medical conditions that are common after age 45. Certain conditions and lifestyle choices may make you more likely to have a fall. Your health care provider may recommend:  Regular vision checks. Poor vision and conditions such as cataracts can make you more likely to have a fall. If you wear glasses, make sure to get your prescription updated if your vision changes.  Medicine review. Work with your health care provider to regularly review all of the medicines you are taking, including over-the-counter medicines. Ask your health care provider about any side effects that may make you more likely to have a fall. Tell your health care provider if any medicines that you take make you feel dizzy or sleepy.  Osteoporosis screening. Osteoporosis is a condition  that causes the bones to get weaker. This can make the bones weak and cause them to break more easily.  Blood pressure screening. Blood pressure changes and medicines to control blood pressure can make you feel dizzy.  Strength and balance checks. Your health care provider may recommend certain tests to check your strength and balance while standing, walking, or changing positions.  Foot health exam. Foot pain and numbness, as well as not wearing proper footwear, can make you more likely to have a fall.  Depression screening. You may be more likely to have a fall if you have a fear of falling, feel emotionally low, or feel unable to do activities that you used to do.  Alcohol use screening. Using too much alcohol can affect your balance and may make you more likely to have a fall. What actions can I take to lower my risk of falls? General instructions  Talk with your health care provider about your risks for falling. Tell your health care provider if: ? You fall. Be sure to tell your health care provider about all falls, even ones that seem minor. ? You feel dizzy, sleepy, or off-balance.  Take over-the-counter and prescription medicines only as told by your health care provider. These include any supplements.  Eat a healthy diet and maintain a healthy weight. A healthy diet includes low-fat dairy products, low-fat (lean) meats, and fiber from whole grains, beans, and lots of fruits and vegetables. Home safety  Remove any tripping hazards, such as rugs, cords, and clutter.  Install safety equipment such as grab bars in bathrooms and safety rails on stairs.  Keep rooms and walkways well-lit. Activity   Follow a regular exercise program to stay fit. This will help you maintain your balance. Ask your health care provider what types of exercise are appropriate for you.  If you need a cane or walker, use it as recommended by your health care provider.  Wear supportive shoes that have  nonskid soles. Lifestyle  Do not drink alcohol if your health care provider tells you not to drink.  If you drink alcohol, limit how much you have: ? 0-1 drink a day for women. ? 0-2 drinks a day for men.  Be aware of how much alcohol is in your drink. In the [place] one drink equals one typical bottle of beer (12 oz), one-half glass of wine (5 oz), or one shot of hard liquor (1 oz).  Do not use any products that contain nicotine or tobacco, such as cigarettes and e-cigarettes. If you need help quitting, ask your health care provider. Summary  Having a healthy lifestyle and getting preventive care can help to protect your health and wellness after age 29.  Screening and testing are the best way to find a health problem early and help you avoid having a fall. Early diagnosis and treatment give you the best chance for managing medical conditions that are more common for people who are older than age 10.  Falls are a major cause of broken bones and head injuries in people who are older than age 50. Take precautions to prevent a fall at home.  Work with your health care provider to learn what changes you can make to improve your health and wellness and to prevent falls. This information is not intended to replace advice given to you by your health care provider. Make sure you discuss any questions you have with your health care provider. Document Released: 08/03/2017 Document Revised: 08/03/2017 Document Reviewed: 08/03/2017 Elsevier Interactive Patient Education  2019 [employer].

## 2019-03-30 ENCOUNTER — Encounter: Payer: Self-pay | Admitting: Family Medicine

## 2019-04-10 ENCOUNTER — Telehealth: Payer: Self-pay | Admitting: Family Medicine

## 2019-04-10 NOTE — Telephone Encounter (Signed)
Called pt for follow-up per [employer]. Left msg to callback M-F 8-4

## 2019-04-16 ENCOUNTER — Telehealth: Payer: Self-pay | Admitting: *Deleted

## 2019-04-16 NOTE — Telephone Encounter (Signed)
Attempted to reach pt to see if he has questions regarding the mychart message / results and left message to return my call. OK for PEC / nurse to discuss with pt.  Copied from [place] [phone]. Topic: General - Other >> Apr 11, 2019 12:45 PM Burchel, Abbi R wrote: Pt returning call re: Mychart msg/labs

## 2019-04-17 DIAGNOSIS — C61 Malignant neoplasm of prostate: Secondary | ICD-10-CM | POA: Diagnosis not present

## 2019-04-29 ENCOUNTER — Emergency Department (HOSPITAL_COMMUNITY)
Admission: EM | Admit: 2019-04-29 | Discharge: 2019-04-29 | Disposition: A | Discharge status: Home / Self Care | Payer: Medicare HMO | Attending: Emergency Medicine | Admitting: Emergency Medicine

## 2019-04-29 ENCOUNTER — Emergency Department (HOSPITAL_COMMUNITY): Payer: Medicare HMO

## 2019-04-29 ENCOUNTER — Encounter (HOSPITAL_COMMUNITY): Payer: Self-pay | Admitting: Emergency Medicine

## 2019-04-29 ENCOUNTER — Other Ambulatory Visit: Payer: Self-pay

## 2019-04-29 DIAGNOSIS — Z8546 Personal history of malignant neoplasm of prostate: Secondary | ICD-10-CM | POA: Insufficient documentation

## 2019-04-29 DIAGNOSIS — E119 Type 2 diabetes mellitus without complications: Secondary | ICD-10-CM | POA: Insufficient documentation

## 2019-04-29 DIAGNOSIS — R609 Edema, unspecified: Secondary | ICD-10-CM | POA: Diagnosis not present

## 2019-04-29 DIAGNOSIS — M7989 Other specified soft tissue disorders: Secondary | ICD-10-CM | POA: Diagnosis not present

## 2019-04-29 DIAGNOSIS — Y9301 Activity, walking, marching and hiking: Secondary | ICD-10-CM | POA: Diagnosis not present

## 2019-04-29 DIAGNOSIS — Z7982 Long term (current) use of aspirin: Secondary | ICD-10-CM | POA: Diagnosis not present

## 2019-04-29 DIAGNOSIS — S8992XA Unspecified injury of left lower leg, initial encounter: Secondary | ICD-10-CM | POA: Diagnosis not present

## 2019-04-29 DIAGNOSIS — I10 Essential (primary) hypertension: Secondary | ICD-10-CM | POA: Diagnosis not present

## 2019-04-29 DIAGNOSIS — Z7984 Long term (current) use of oral hypoglycemic drugs: Secondary | ICD-10-CM | POA: Diagnosis not present

## 2019-04-29 DIAGNOSIS — W010XXA Fall on same level from slipping, tripping and stumbling without subsequent striking against object, initial encounter: Secondary | ICD-10-CM | POA: Insufficient documentation

## 2019-04-29 DIAGNOSIS — W19XXXA Unspecified fall, initial encounter: Secondary | ICD-10-CM | POA: Diagnosis not present

## 2019-04-29 DIAGNOSIS — Y929 Unspecified place or not applicable: Secondary | ICD-10-CM | POA: Insufficient documentation

## 2019-04-29 DIAGNOSIS — M25462 Effusion, left knee: Secondary | ICD-10-CM | POA: Insufficient documentation

## 2019-04-29 DIAGNOSIS — M2540 Effusion, unspecified joint: Secondary | ICD-10-CM

## 2019-04-29 DIAGNOSIS — Y999 Unspecified external cause status: Secondary | ICD-10-CM | POA: Insufficient documentation

## 2019-04-29 DIAGNOSIS — S8990XA Unspecified injury of unspecified lower leg, initial encounter: Secondary | ICD-10-CM

## 2019-04-29 DIAGNOSIS — Z79899 Other long term (current) drug therapy: Secondary | ICD-10-CM | POA: Diagnosis not present

## 2019-04-29 NOTE — ED Triage Notes (Signed)
Pt transported from home by EMS after mechanical fall while walking through pine needles about 1 hour ago, pt states he was assisted to his feet and ?felt patella pop back in, attempted to stand and now feels like patella is out again. Significant swelling to L knee. No other injuries, CMS intact distal injury.

## 2019-04-29 NOTE — Progress Notes (Signed)
Orthopedic Tech Progress Note Patient Details:  Jason Obrien Apr 16, 1951 [id number]  Ortho Devices Type of Ortho Device: Crutches Ortho Device/Splint Interventions: Ordered, Application, Adjustment   Post Interventions Patient Tolerated: Well Instructions Provided: Care of device, Adjustment of device   [name] 04/29/2019, 11:44 PM

## 2019-04-29 NOTE — Discharge Instructions (Signed)
Your x-ray did not show evidence of a broken bone or dislocation.  You do have a large effusion which is likely due to knee trauma/sprain.    Wear a knee immobilizer at all times, though you may remove this to bathe/shower.  Use crutches to prevent from putting weight on your left leg.  Continue icing of your joint 3-4 times per day for 15 to 20 minutes each time.  Take Tylenol or ibuprofen for management of any residual knee pain.  We recommend close follow-up with orthopedics, preferably in the next 1 to 2 weeks.  If you begin to experience any numbness, extremity weakness, fevers, redness or heat to the joint, or other concerning symptoms, return to the emergency department for repeat evaluation.

## 2019-04-29 NOTE — ED Provider Notes (Signed)
[facility] EMERGENCY DEPARTMENT Provider Note   CSN: [id number] Arrival date & time: 04/29/19  2103     History   Chief Complaint Chief Complaint  Patient presents with  . Knee Injury    HPI Jason Obrien is a 68 y.o. male.     68 year old male with a history of prostate cancer, dyslipidemia, obesity presents to the emergency department for evaluation of left knee injury.  He states that he was walking on pine straw around 2000 tonight when he slipped and fell.  He felt his left knee shift and thinks that his patella may have "popped" and dislocated.  He did attempt to stand following the fall, but felt that his knee was more unstable.  Has had progressive swelling to the left knee, though he denies any significant pain with rest or immobilization.  He has not taken any medications prior to arrival for pain.  Is on a baby aspirin daily.  Denies the use of other chronic anticoagulants.  He has no extremity numbness or paresthesias.  No history of prior knee injury.  Is not actively followed by orthopedics.  The history is provided by the patient. No language interpreter was used.    Past Medical History:  Diagnosis Date  . Cancer [facility])    Prostate Cancer for SX in Dec.2017  . Chicken pox    childhood  . Kidney stones    In 2013 or 2014    Patient Active Problem List   Diagnosis Date Noted  . Dyslipidemia 06/14/2017  . Prostate cancer ([place]) 07/21/2016  . Pre-diabetes 07/21/2016  . Nephrolithiasis 07/07/2016  . Elevated PSA 05/31/2016  . Overweight 05/20/2016    Past Surgical History:  Procedure Laterality Date  . COLONOSCOPY  01/30/2003  . OTHER SURGICAL HISTORY     Right arm surgery for compound fracture   . robotic assisted lap radical prostatectomy N/A 09/14/2016   prostate cancer- done pe [facility], Dr. Nevada Crane        Home Medications    Prior to Admission medications   Medication Sig Start Date End Date Taking? Authorizing Provider  aspirin  (BAYER ASPIRIN EC LOW DOSE) 81 MG EC tablet Take 81 mg by mouth daily. Swallow whole.    [provider]  Glucosamine-Chondroit-Vit C-Mn (GLUCOSAMINE 1500 COMPLEX) CAPS Take 1 capsule by mouth daily.    [provider]  lovastatin (MEVACOR) 40 MG tablet Take 1 tablet (40 mg total) by mouth daily. 03/29/19   Copland, Gay Filler, MD  metFORMIN (GLUCOPHAGE) 500 MG tablet Take 1 tablet (500 mg total) by mouth daily. 03/29/19   Copland, Gay Filler, MD  Multiple Vitamins-Minerals (ONE-A-DAY MENS 50+ ADVANTAGE PO) Take 1 capsule by mouth daily.    [provider]  Omega-3 Fatty Acids (FISH OIL ULTRA) 1400 MG CAPS Take 1 capsule by mouth daily.    [provider]    Family History Family History  Problem Relation Age of Onset  . Hypertension Father   . Heart failure Father        died age 30  . Colon cancer Paternal Grandmother     Social History Social History   Tobacco Use  . Smoking status: Never Smoker  . Smokeless tobacco: Never Used  Substance Use Topics  . Alcohol use: Yes    Alcohol/week: 3.0 standard drinks    Types: 3 Shots of liquor per week    Comment: Social only   . Drug use: No  Allergies   Patient has no known allergies.   Review of Systems Review of Systems Ten systems reviewed and are negative for acute change, except as noted in the HPI.    Physical Exam Updated Vital Signs BP (!) 155/88 (BP Location: Right Arm)   Pulse 69   Temp 98.7 F (37.1 C) (Oral)   Resp 14   Ht 5\' 10"  (1.778 m)   Wt 113 kg   SpO2 98%   BMI 35.74 kg/m   Physical Exam Vitals signs and nursing note reviewed.  Constitutional:      General: He is not in acute distress.    Appearance: He is well-developed. He is not diaphoretic.     Comments: Nontoxic appearing and in NAD  HENT:     Head: Normocephalic and atraumatic.  Eyes:     General: No scleral icterus.    Conjunctiva/sclera: Conjunctivae normal.  Neck:     Musculoskeletal: Normal  range of motion.  Cardiovascular:     Rate and Rhythm: Normal rate and regular rhythm.     Pulses: Normal pulses.     Comments: DP pulse 2+ in the left lower extremity.  Capillary refill brisk in all digits of the left foot. Pulmonary:     Effort: Pulmonary effort is normal. No respiratory distress.     Comments: Respirations even and unlabored Musculoskeletal:     Left knee: He exhibits decreased range of motion, swelling, effusion and ecchymosis. He exhibits no deformity and normal alignment. Tenderness found.     Comments: Limited range of motion of the left knee, likely secondary to large joint effusion.  There is mild ecchymosis to the medial aspect of the left knee.  No frank hematoma.  No appreciable deformity or crepitus.  Distal left lower extremity intact with full range of motion of the ankle and foot.  Compartments to the thigh and distal left lower extremity are soft, compressible.  Skin:    General: Skin is warm and dry.     Coloration: Skin is not pale.     Findings: No erythema or rash.  Neurological:     Mental Status: He is alert and oriented to person, place, and time.  Psychiatric:        Behavior: Behavior normal.      ED Treatments / Results  Labs (all labs ordered are listed, but only abnormal results are displayed) Labs Reviewed - No data to display  EKG None  Radiology Dg Knee Complete 4 Views Left  Result Date: 04/29/2019 CLINICAL DATA:  Fall EXAM: LEFT KNEE - COMPLETE 4+ VIEW COMPARISON:  None. FINDINGS: No acute displaced fracture or malalignment is seen. Prominent soft tissue swelling anterior and superior to the patella with probable moderate knee effusion. Mild patellofemoral degenerative change. IMPRESSION: No definite acute osseous abnormality. Significant soft tissue swelling anterior and superior to the patella with probable knee effusion. Electronically Signed   By: Donavan Foil M.D.   On: 04/29/2019 21:40    Procedures Procedures (including  critical care time)  Medications Ordered in ED Medications - No data to display   Initial Impression / Assessment and Plan / ED Course  I have reviewed the triage vital signs and the nursing notes.  Pertinent labs & imaging results that were available during my care of the patient were reviewed by me and considered in my medical decision making (see chart for details).        Patient presents to the emergency department for evaluation of L  knee pain. Pain onset after tripping pine straw. Patient neurovascularly intact on exam. Imaging negative for fracture, dislocation, bony deformity. There is a large effusion to the left knee joint likely 2/2 recent trauma. Compartments in the affected extremity are soft.   Plan for supportive management including RICE and NSAIDs. Patient placed in knee immobilizer and instructed to remain NWB until he is able to follow up with Orthopedics. Return precautions discussed and provided. Patient discharged in stable condition with no unaddressed concerns.   Final Clinical Impressions(s) / ED Diagnoses   Final diagnoses:  Knee joint injury, initial encounter  Traumatic joint effusion    ED Discharge Orders    None       Antonietta Breach, PA-C 04/29/19 2229    Sherwood Gambler, MD 05/03/19 [phone]

## 2019-04-29 NOTE — ED Notes (Signed)
Ortho tech paged  

## 2019-04-29 NOTE — ED Notes (Signed)
Pt verbalized understanding of d/c instructions, medications, follow up and immobilizer/crutch usage.

## 2019-04-29 NOTE — ED Notes (Signed)
EMT at bedside placing knee immobilizer

## 2019-05-02 DIAGNOSIS — S76192A Other specified injury of left quadriceps muscle, fascia and tendon, initial encounter: Secondary | ICD-10-CM | POA: Diagnosis not present

## 2019-05-02 DIAGNOSIS — M25562 Pain in left knee: Secondary | ICD-10-CM | POA: Diagnosis not present

## 2019-05-02 DIAGNOSIS — M25531 Pain in right wrist: Secondary | ICD-10-CM | POA: Diagnosis not present

## 2019-05-02 DIAGNOSIS — S63521A Sprain of radiocarpal joint of right wrist, initial encounter: Secondary | ICD-10-CM | POA: Diagnosis not present

## 2019-05-04 ENCOUNTER — Other Ambulatory Visit (HOSPITAL_COMMUNITY)
Admission: RE | Admit: 2019-05-04 | Discharge: 2019-05-04 | Disposition: A | Discharge status: Home / Self Care | Payer: Medicare HMO | Source: Ambulatory Visit | Attending: Orthopedic Surgery | Admitting: Orthopedic Surgery

## 2019-05-04 DIAGNOSIS — Z20828 Contact with and (suspected) exposure to other viral communicable diseases: Secondary | ICD-10-CM | POA: Diagnosis not present

## 2019-05-04 DIAGNOSIS — Z01812 Encounter for preprocedural laboratory examination: Secondary | ICD-10-CM | POA: Diagnosis not present

## 2019-05-04 DIAGNOSIS — S76192A Other specified injury of left quadriceps muscle, fascia and tendon, initial encounter: Secondary | ICD-10-CM | POA: Diagnosis not present

## 2019-05-04 LAB — SARS CORONAVIRUS 2 (TAT 6-24 HRS): SARS Coronavirus 2: NEGATIVE

## 2019-05-04 NOTE — Patient Instructions (Addendum)
YOU NEED TO HAVE A COVID 19 TEST ON_______ @_______ , THIS TEST MUST BE DONE BEFORE SURGERY, COME  [address] [place] , [address]. ONCE YOUR COVID TEST IS COMPLETED, PLEASE BEGIN THE QUARANTINE INSTRUCTIONS AS OUTLINED IN YOUR HANDOUT.                Jason Obrien    Your procedure is scheduled on:8/4   Report to [facility] Main  Entrance  Report to admitting at 2:15 PM  AM   1 VISITOR IS ALLOWED TO WAIT IN WAITING ROOM  ONLY DAY OF YOUR SURGERY.    Call this number if you have problems the morning of surgery [phone]    Remember:  [place], NO CHEWING GUM CANDY OR MINTS.   Do not eat food After Midnight. YOU MAY HAVE CLEAR LIQUIDS FROM MIDNIGHT UNTIL 41:15 PM  At 1:15PMlease finish the prescribed Pre-Surgery Gatorade drink. Nothing by mouth after you finish the Gatorade drink !  CLEAR LIQUID DIET   Foods Allowed                                                                     Foods Excluded  Coffee and tea, regular and decaf                             liquids that you cannot  Plain Jell-O any favor except red or purple                                           see through such as: Fruit ices (not with fruit pulp)                                     milk, soups, orange juice  Iced Popsicles                                    All solid food Carbonated beverages, regular and diet                                    Cranberry, grape and apple juices Sports drinks like Gatorade Lightly seasoned clear broth or consume(fat free) Sugar, honey syrup  Sample Menu Breakfast                                Lunch                                     Supper Cranberry juice                    Beef broth  Chicken broth Jell-O                                     Grape juice                           Apple juice Coffee or tea                        Jell-O                                       Popsicle                                                Coffee or tea                        Coffee or tea  _____________________________________________________________________     Take these medicines the morning of surgery with A SIP OF WATER: none DO NOT TAKE ANY DIABETIC MEDICATIONS DAY OF YOUR SURGERY                               You may not have any metal on your body including  and              piercings  Do not wear jewelry, , lotions, powders or , deodorant             Do not wear nail polish.  Do not shave  48 hours prior to surgery.              Men may shave face and neck.   Do not bring valuables to the hospital. [place].  Contacts, dentures or bridgework may not be worn into surgery.      Patients discharged the day of surgery will not be allowed to drive home. IF YOU ARE HAVING SURGERY AND GOING HOME THE SAME DAY, YOU MUST HAVE AN ADULT TO DRIVE YOU HOME AND BE WITH YOU FOR 24 HOURS. YOU MAY GO HOME BY TAXI OR UBER OR ORTHERWISE, BUT AN ADULT MUST ACCOMPANY YOU HOME AND STAY WITH YOU FOR 24 HOURS.  Name and phone number of your driver:  Special Instructions: N/A              Please read over the following fact sheets you were given: _____________________________________________________________________             [facility] - Preparing for Surgery Before surgery, you can play an important role.  Because skin is not sterile, your skin needs to be as free of germs as possible.  You can reduce the number of germs on your skin by washing with CHG (chlorahexidine gluconate) soap before surgery.  CHG is an antiseptic cleaner which kills germs and bonds with the skin to continue killing germs even after washing. Please DO NOT use if you have an allergy to CHG or antibacterial soaps.  If your  skin becomes reddened/irritated stop using the CHG and inform your nurse when you arrive at Short Stay. Do not shave (including  legs and underarms) for at least 48 hours prior to the first CHG shower.  You may shave your face/neck. Please follow these instructions carefully:  1.  Shower with CHG Soap the night before surgery and the  morning of Surgery.  2.  If you choose to wash your hair, wash your hair first as usual with your  normal  shampoo.  3.  After you shampoo, rinse your hair and body thoroughly to remove the  shampoo.                                        4.  Use CHG as you would any other liquid soap.  You can apply chg directly  to the skin and wash                       Gently with a scrungie or clean washcloth.  5.  Apply the CHG Soap to your body ONLY FROM THE NECK DOWN.   Do not use on face/ open                           Wound or open sores. Avoid contact with eyes, ears mouth and genitals (private parts).                       Wash face,  Genitals (private parts) with your normal soap.             6.  Wash thoroughly, paying special attention to the area where your surgery  will be performed.  7.  Thoroughly rinse your body with warm water from the neck down.  8.  DO NOT shower/wash with your normal soap after using and rinsing off  the CHG Soap.                9.  Pat yourself dry with a clean towel.            10.  Wear clean pajamas.            11.  Place clean sheets on your bed the night of your first shower and do not  sleep with pets. Day of Surgery : Do not apply any lotions/deodorants the morning of surgery.  Please wear clean clothes to the hospital/surgery center.  FAILURE TO FOLLOW THESE INSTRUCTIONS MAY RESULT IN THE CANCELLATION OF YOUR SURGERY PATIENT SIGNATURE_________________________________  NURSE SIGNATURE__________________________________  ________________________________________________________________________   Jason Obrien  An incentive spirometer is a tool that can help keep your lungs clear and active. This tool measures how well you are filling your lungs  with each breath. Taking long deep breaths may help reverse or decrease the chance of developing breathing (pulmonary) problems (especially infection) following:  A long period of time when you are unable to move or be active. BEFORE THE PROCEDURE   If the spirometer includes an indicator to show your best effort, your nurse or respiratory therapist will set it to a desired goal.  If possible, sit up straight or lean slightly forward. Try not to slouch.  Hold the incentive spirometer in an upright position. INSTRUCTIONS FOR USE  1. Sit on the edge of your bed if possible, or  sit up as far as you can in bed or on a chair. 2. Hold the incentive spirometer in an upright position. 3. Breathe out normally. 4. Place the mouthpiece in your mouth and seal your lips tightly around it. 5. Breathe in slowly and as deeply as possible, raising the piston or the ball toward the top of the column. 6. Hold your breath for 3-5 seconds or for as long as possible. Allow the piston or ball to fall to the bottom of the column. 7. Remove the mouthpiece from your mouth and breathe out normally. 8. Rest for a few seconds and repeat Steps 1 through 7 at least 10 times every 1-2 hours when you are awake. Take your time and take a few normal breaths between deep breaths. 9. The spirometer may include an indicator to show your best effort. Use the indicator as a goal to work toward during each repetition. 10. After each set of 10 deep breaths, practice coughing to be sure your lungs are clear. If you have an incision (the cut made at the time of surgery), support your incision when coughing by placing a pillow or rolled up towels firmly against it. Once you are able to get out of bed, walk around indoors and cough well. You may stop using the incentive spirometer when instructed by your caregiver.  RISKS AND COMPLICATIONS  Take your time so you do not get dizzy or light-headed.  If you are in pain, you may need to  take or ask for pain medication before doing incentive spirometry. It is harder to take a deep breath if you are having pain. AFTER USE  Rest and breathe slowly and easily.  It can be helpful to keep track of a log of your progress. Your caregiver can provide you with a simple table to help with this. If you are using the spirometer at home, follow these instructions: [place] IF:   You are having difficultly using the spirometer.  You have trouble using the spirometer as often as instructed.  Your pain medication is not giving enough relief while using the spirometer.  You develop fever of 100.5 F (38.1 C) or higher. SEEK IMMEDIATE MEDICAL CARE IF:   You cough up bloody sputum that had not been present before.  You develop fever of 102 F (38.9 C) or greater.  You develop worsening pain at or near the incision site. MAKE SURE YOU:   Understand these instructions.  Will watch your condition.  Will get help right away if you are not doing well or get worse. Document Released: 01/31/2007 Document Revised: 12/13/2011 Document Reviewed: 04/03/2007 [facility] Patient Information 2014 [place], [place].   ________________________________________________________________________

## 2019-05-07 ENCOUNTER — Other Ambulatory Visit: Payer: Self-pay

## 2019-05-07 ENCOUNTER — Encounter (HOSPITAL_COMMUNITY)
Admission: RE | Admit: 2019-05-07 | Discharge: 2019-05-07 | Disposition: A | Discharge status: Home / Self Care | Payer: Medicare HMO | Source: Ambulatory Visit | Attending: Orthopedic Surgery | Admitting: Orthopedic Surgery

## 2019-05-07 ENCOUNTER — Encounter (HOSPITAL_COMMUNITY): Payer: Self-pay

## 2019-05-07 DIAGNOSIS — Z7984 Long term (current) use of oral hypoglycemic drugs: Secondary | ICD-10-CM | POA: Diagnosis not present

## 2019-05-07 DIAGNOSIS — Z79899 Other long term (current) drug therapy: Secondary | ICD-10-CM | POA: Diagnosis not present

## 2019-05-07 DIAGNOSIS — Z791 Long term (current) use of non-steroidal anti-inflammatories (NSAID): Secondary | ICD-10-CM | POA: Diagnosis not present

## 2019-05-07 DIAGNOSIS — Z87442 Personal history of urinary calculi: Secondary | ICD-10-CM | POA: Diagnosis not present

## 2019-05-07 DIAGNOSIS — X58XXXA Exposure to other specified factors, initial encounter: Secondary | ICD-10-CM | POA: Insufficient documentation

## 2019-05-07 DIAGNOSIS — W010XXA Fall on same level from slipping, tripping and stumbling without subsequent striking against object, initial encounter: Secondary | ICD-10-CM | POA: Diagnosis not present

## 2019-05-07 DIAGNOSIS — Z8546 Personal history of malignant neoplasm of prostate: Secondary | ICD-10-CM | POA: Diagnosis not present

## 2019-05-07 DIAGNOSIS — Z7982 Long term (current) use of aspirin: Secondary | ICD-10-CM | POA: Diagnosis not present

## 2019-05-07 DIAGNOSIS — M1990 Unspecified osteoarthritis, unspecified site: Secondary | ICD-10-CM | POA: Diagnosis not present

## 2019-05-07 DIAGNOSIS — R7303 Prediabetes: Secondary | ICD-10-CM | POA: Diagnosis not present

## 2019-05-07 DIAGNOSIS — Z01812 Encounter for preprocedural laboratory examination: Secondary | ICD-10-CM | POA: Insufficient documentation

## 2019-05-07 DIAGNOSIS — S76112A Strain of left quadriceps muscle, fascia and tendon, initial encounter: Secondary | ICD-10-CM | POA: Insufficient documentation

## 2019-05-07 HISTORY — DX: Prediabetes: R73.03

## 2019-05-07 HISTORY — DX: Unspecified osteoarthritis, unspecified site: M19.90

## 2019-05-07 LAB — CBC
HCT: 37.5 % — ABNORMAL LOW (ref 39.0–52.0)
Hemoglobin: 11.9 g/dL — ABNORMAL LOW (ref 13.0–17.0)
MCH: 32.5 pg (ref 26.0–34.0)
MCHC: 31.7 g/dL (ref 30.0–36.0)
MCV: 102.5 fL — ABNORMAL HIGH (ref 80.0–100.0)
Platelets: 281 10*3/uL (ref 150–400)
RBC: 3.66 MIL/uL — ABNORMAL LOW (ref 4.22–5.81)
RDW: 13 % (ref 11.5–15.5)
WBC: 6.1 10*3/uL (ref 4.0–10.5)
nRBC: 0 % (ref 0.0–0.2)

## 2019-05-07 LAB — HEMOGLOBIN A1C
Hgb A1c MFr Bld: 5.7 % — ABNORMAL HIGH (ref 4.8–5.6)
Mean Plasma Glucose: 116.89 mg/dL

## 2019-05-07 LAB — BASIC METABOLIC PANEL
Anion gap: 10 (ref 5–15)
BUN: 22 mg/dL (ref 8–23)
CO2: 26 mmol/L (ref 22–32)
Calcium: 9.2 mg/dL (ref 8.9–10.3)
Chloride: 103 mmol/L (ref 98–111)
Creatinine, Ser: 0.81 mg/dL (ref 0.61–1.24)
GFR calc Af Amer: >60 mL/min (ref >60)
GFR calc non Af Amer: >60 mL/min (ref >60)
Glucose, Bld: 119 mg/dL — ABNORMAL HIGH (ref 70–99)
Potassium: 4.5 mmol/L (ref 3.5–5.1)
Sodium: 139 mmol/L (ref 135–145)

## 2019-05-07 LAB — ABO/RH: ABO/RH(D): A NEG

## 2019-05-07 NOTE — H&P (Addendum)
Jason Obrien is an 68 y.o. male.    Chief Complaint:   Left quad tendon rupture  Procedure:     Left quad tendon repair  HPI: Pt is a 68 y.o. male complaining of left knee pain since 04/29/2019. On that date the patient slipped on some grass and the leg folded under him.  He immediately had issues with the lg and needed assistance in getting up.  Pain has been stable since the beginning, but difficulty standing on the left leg. Imaging of the left knee reveals a left quad tendon rupture.  Various options are discussed with the patient and he wishes to proceed with a repair of the quad tendon. Risks, benefits and expectations were discussed with the patient. Patient understand the risks, benefits and expectations and wishes to proceed with surgery.    PCP: Darreld Mclean, MD  D/C Plans:       Home   Post-op Meds:       No Rx given   Tranexamic Acid:      To be given - IV   Decadron:      Is to be given  FYI:      ASA  Norco  DME:    Rx given for - RW & 3-n-1  PT:   No PT    Pharmacy: [facility]    PMH: Past Medical History:  Diagnosis Date  . Arthritis   . Cancer [facility])    Prostate Cancer for SX in Dec.2017  . Chicken pox    childhood  . History of kidney stones 03/2018  . Kidney stones    In 2013 or 2014  . Pre-diabetes     PSH: Past Surgical History:  Procedure Laterality Date  . COLONOSCOPY  01/30/2003  . OTHER SURGICAL HISTORY     Right arm surgery for compound fracture   . robotic assisted lap radical prostatectomy N/A 09/14/2016   prostate cancer- done pe [facility], Dr. Nevada Crane    Social History:  reports that he has never smoked. He has never used smokeless tobacco. He reports current alcohol use of about 3.0 standard drinks of alcohol per week. He reports that he does not use drugs.  Allergies:  No Known Allergies  Medications: No current facility-administered medications for this encounter.    Current Outpatient Medications   Medication Sig Dispense Refill  . Bioflavonoid Products (BIOFLEX PO) Take 1 tablet by mouth daily.    Marland Kitchen ibuprofen (ADVIL) 200 MG tablet Take 400-600 mg by mouth every 6 (six) hours as needed for headache or moderate pain.    Marland Kitchen lovastatin (MEVACOR) 40 MG tablet Take 1 tablet (40 mg total) by mouth daily. (Patient taking differently: Take 40 mg by mouth daily before supper. ) 90 tablet 3  . metFORMIN (GLUCOPHAGE) 500 MG tablet Take 1 tablet (500 mg total) by mouth daily. (Patient taking differently: Take 500 mg by mouth daily before supper. ) 90 tablet 3  . Multiple Vitamins-Minerals (ONE-A-DAY MENS 50+ ADVANTAGE PO) Take 1 capsule by mouth daily.    . Omega-3 Fatty Acids (FISH OIL ULTRA) 1400 MG CAPS Take 1,400 mg by mouth daily.     Marland Kitchen aspirin (BAYER ASPIRIN EC LOW DOSE) 81 MG EC tablet Take 81 mg by mouth daily. Swallow whole.      Results for orders placed or performed during the hospital encounter of 05/07/19 (from the past 48 hour(s))  Type and screen Order type and screen if day of  surgery is less than 15 days from draw of preadmission visit or order morning of surgery if day of surgery is greater than 6 days from preadmission visit.     Status: None   Collection Time: 05/07/19 11:16 AM  Result Value Ref Range   ABO/RH(D) A NEG    Antibody Screen NEG    Sample Expiration 05/21/2019,2359    Extend sample reason      NO TRANSFUSIONS OR PREGNANCY IN THE PAST 3 MONTHS Performed at [facility], [place] [address], [place], [place] [address]   Hemoglobin A1c     Status: Abnormal   Collection Time: 05/07/19 11:16 AM  Result Value Ref Range   Hgb A1c MFr Bld 5.7 (H) 4.8 - 5.6 %    Comment: (NOTE) Pre diabetes:          5.7%-6.4% Diabetes:              >6.4% Glycemic control for   <7.0% adults with diabetes    Mean Plasma Glucose 116.89 mg/dL    Comment: Performed at [facility] [address] [place], [place] [address]  Basic metabolic panel     Status: Abnormal    Collection Time: 05/07/19 11:16 AM  Result Value Ref Range   Sodium 139 135 - 145 mmol/L   Potassium 4.5 3.5 - 5.1 mmol/L   Chloride 103 98 - 111 mmol/L   CO2 26 22 - 32 mmol/L   Glucose, Bld 119 (H) 70 - 99 mg/dL   BUN 22 8 - 23 mg/dL   Creatinine, Ser 0.81 0.61 - 1.24 mg/dL   Calcium 9.2 8.9 - 10.3 mg/dL   GFR calc non Af Amer >60 >60 mL/min   GFR calc Af Amer >60 >60 mL/min   Anion gap 10 5 - 15    Comment: Performed at [facility], [place] [address] [place], [place] [address]  CBC     Status: Abnormal   Collection Time: 05/07/19 11:16 AM  Result Value Ref Range   WBC 6.1 4.0 - 10.5 K/uL   RBC 3.66 (L) 4.22 - 5.81 MIL/uL   Hemoglobin 11.9 (L) 13.0 - 17.0 g/dL   HCT 37.5 (L) 39.0 - 52.0 %   MCV 102.5 (H) 80.0 - 100.0 fL   MCH 32.5 26.0 - 34.0 pg   MCHC 31.7 30.0 - 36.0 g/dL   RDW 13.0 11.5 - 15.5 %   Platelets 281 150 - 400 K/uL   nRBC 0.0 0.0 - 0.2 %    Comment: Performed at [facility], [place] [address] [place], [place] [address]  ABO/Rh     Status: None   Collection Time: 05/07/19 11:16 AM  Result Value Ref Range   ABO/RH(D)      A NEG Performed at [facility] [address], [place], [place] [address]       Review of Systems  Constitutional: Negative.   HENT: Negative.   Eyes: Negative.   Respiratory: Negative.   Cardiovascular: Negative.   Gastrointestinal: Negative.   Genitourinary: Negative.   Musculoskeletal: Positive for joint pain.  Skin: Negative.   Neurological: Negative.   Endo/Heme/Allergies: Negative.   Psychiatric/Behavioral: Negative.        Physical Exam  Constitutional: He is oriented to person, place, and time. He appears well-developed.  HENT:  Head: Normocephalic.  Eyes: Pupils are equal, round, and reactive to light.  Neck: Neck supple. No JVD present. No tracheal deviation present.  No thyromegaly present.  Cardiovascular: Normal rate, regular rhythm and intact distal  pulses.  Respiratory: Effort normal and breath sounds normal. No respiratory distress. He has no wheezes.  GI: Soft. There is no abdominal tenderness. There is no guarding.  Musculoskeletal:     Left knee: He exhibits decreased range of motion, swelling, effusion, deformity, abnormal alignment and abnormal patellar mobility. He exhibits no ecchymosis, no laceration and no erythema. Tenderness found.  Lymphadenopathy:    He has no cervical adenopathy.  Neurological: He is alert and oriented to person, place, and time.  Skin: Skin is warm and dry.  Psychiatric: He has a normal mood and affect.       Assessment/Plan Assessment:    Left quad tendon rupture  Plan: Patient will undergo a left quad tendon repair on 05/08/2019 per Dr. Alvan Dame at [facility]. Risks benefits and expectations were discussed with the patient. Patient understand risks, benefits and expectations and wishes to proceed.     [place] Lucas Exline   PA-C  05/07/2019, 10:19 PM

## 2019-05-07 NOTE — Progress Notes (Signed)
Patient has stopped his ASA and supplements 7/30. Per Dr. Alvan Dame. Dr. Lorelei Pont manages his medications. Pt is pre diabetic and takes Metformin. He was told to not take in AM of surgery at PAT visit

## 2019-05-08 ENCOUNTER — Ambulatory Visit (HOSPITAL_COMMUNITY): Payer: Medicare HMO | Admitting: Registered Nurse

## 2019-05-08 ENCOUNTER — Encounter (HOSPITAL_COMMUNITY)
Admission: RE | Disposition: A | Discharge status: Home / Self Care | Payer: Self-pay | Source: Home / Self Care | Attending: Orthopedic Surgery

## 2019-05-08 ENCOUNTER — Ambulatory Visit (HOSPITAL_COMMUNITY): Payer: Medicare HMO | Admitting: Physician Assistant

## 2019-05-08 ENCOUNTER — Other Ambulatory Visit: Payer: Self-pay

## 2019-05-08 ENCOUNTER — Encounter (HOSPITAL_COMMUNITY): Payer: Self-pay | Admitting: *Deleted

## 2019-05-08 ENCOUNTER — Observation Stay (HOSPITAL_COMMUNITY)
Admission: RE | Admit: 2019-05-08 | Discharge: 2019-05-09 | Disposition: A | Discharge status: Home / Self Care | Payer: Medicare HMO | Attending: Orthopedic Surgery | Admitting: Orthopedic Surgery

## 2019-05-08 DIAGNOSIS — E785 Hyperlipidemia, unspecified: Secondary | ICD-10-CM | POA: Diagnosis not present

## 2019-05-08 DIAGNOSIS — S76112A Strain of left quadriceps muscle, fascia and tendon, initial encounter: Principal | ICD-10-CM | POA: Diagnosis present

## 2019-05-08 DIAGNOSIS — M1990 Unspecified osteoarthritis, unspecified site: Secondary | ICD-10-CM | POA: Insufficient documentation

## 2019-05-08 DIAGNOSIS — G8918 Other acute postprocedural pain: Secondary | ICD-10-CM | POA: Diagnosis not present

## 2019-05-08 DIAGNOSIS — C61 Malignant neoplasm of prostate: Secondary | ICD-10-CM | POA: Diagnosis not present

## 2019-05-08 DIAGNOSIS — S76112D Strain of left quadriceps muscle, fascia and tendon, subsequent encounter: Secondary | ICD-10-CM

## 2019-05-08 DIAGNOSIS — Z791 Long term (current) use of non-steroidal anti-inflammatories (NSAID): Secondary | ICD-10-CM | POA: Diagnosis not present

## 2019-05-08 DIAGNOSIS — Z79899 Other long term (current) drug therapy: Secondary | ICD-10-CM | POA: Diagnosis not present

## 2019-05-08 DIAGNOSIS — R7303 Prediabetes: Secondary | ICD-10-CM | POA: Diagnosis not present

## 2019-05-08 DIAGNOSIS — Z87442 Personal history of urinary calculi: Secondary | ICD-10-CM | POA: Diagnosis not present

## 2019-05-08 DIAGNOSIS — W010XXA Fall on same level from slipping, tripping and stumbling without subsequent striking against object, initial encounter: Secondary | ICD-10-CM | POA: Insufficient documentation

## 2019-05-08 DIAGNOSIS — Z8546 Personal history of malignant neoplasm of prostate: Secondary | ICD-10-CM | POA: Diagnosis not present

## 2019-05-08 DIAGNOSIS — Z7984 Long term (current) use of oral hypoglycemic drugs: Secondary | ICD-10-CM | POA: Insufficient documentation

## 2019-05-08 DIAGNOSIS — Z7982 Long term (current) use of aspirin: Secondary | ICD-10-CM | POA: Diagnosis not present

## 2019-05-08 DIAGNOSIS — S76192A Other specified injury of left quadriceps muscle, fascia and tendon, initial encounter: Secondary | ICD-10-CM | POA: Diagnosis not present

## 2019-05-08 HISTORY — PX: QUADRICEPS TENDON REPAIR: SHX756

## 2019-05-08 LAB — TYPE AND SCREEN
ABO/RH(D): A NEG
Antibody Screen: NEGATIVE

## 2019-05-08 SURGERY — REPAIR, TENDON, QUADRICEPS
Anesthesia: Spinal | Laterality: Left

## 2019-05-08 MED ORDER — PROPOFOL 10 MG/ML IV BOLUS
INTRAVENOUS | Status: AC
  Filled 2019-05-08: qty 40

## 2019-05-08 MED ORDER — DOCUSATE SODIUM 100 MG PO CAPS
100.0000 mg | ORAL_CAPSULE | Freq: Two times a day (BID) | ORAL | Status: DC
  Administered 2019-05-08 – 2019-05-09 (×2): 100 mg via ORAL
  Filled 2019-05-08 (×2): qty 1

## 2019-05-08 MED ORDER — TRANEXAMIC ACID-NACL 1000-0.7 MG/100ML-% IV SOLN
1000.0000 mg | Freq: Once | INTRAVENOUS | Status: AC
  Administered 2019-05-08: 1000 mg via INTRAVENOUS
  Filled 2019-05-08: qty 100

## 2019-05-08 MED ORDER — HYDROMORPHONE HCL 1 MG/ML IJ SOLN
0.5000 mg | INTRAMUSCULAR | Status: DC | PRN
  Administered 2019-05-09: 0.5 mg via INTRAVENOUS
  Filled 2019-05-08: qty 1

## 2019-05-08 MED ORDER — ONDANSETRON HCL 4 MG/2ML IJ SOLN: 4.0000 mg | Freq: Four times a day (QID) | INTRAMUSCULAR | Status: DC | PRN

## 2019-05-08 MED ORDER — MIDAZOLAM HCL 2 MG/2ML IJ SOLN
INTRAMUSCULAR | Status: AC
  Administered 2019-05-08: 2 mg via INTRAVENOUS
  Filled 2019-05-08: qty 2

## 2019-05-08 MED ORDER — ACETAMINOPHEN 500 MG PO TABS
1000.0000 mg | ORAL_TABLET | Freq: Once | ORAL | Status: AC
  Administered 2019-05-08: 1000 mg via ORAL
  Filled 2019-05-08: qty 2

## 2019-05-08 MED ORDER — MENTHOL 3 MG MT LOZG: 1.0000 | LOZENGE | OROMUCOSAL | Status: DC | PRN

## 2019-05-08 MED ORDER — POVIDONE-IODINE 10 % EX SWAB: 2.0000 "application " | Freq: Once | CUTANEOUS | Status: DC

## 2019-05-08 MED ORDER — DEXAMETHASONE SODIUM PHOSPHATE 10 MG/ML IJ SOLN
10.0000 mg | Freq: Once | INTRAMUSCULAR | Status: AC
  Administered 2019-05-09: 10 mg via INTRAVENOUS
  Filled 2019-05-08: qty 1

## 2019-05-08 MED ORDER — METOCLOPRAMIDE HCL 5 MG PO TABS: 5.0000 mg | ORAL_TABLET | Freq: Three times a day (TID) | ORAL | Status: DC | PRN

## 2019-05-08 MED ORDER — CEFAZOLIN SODIUM-DEXTROSE 2-4 GM/100ML-% IV SOLN
2.0000 g | Freq: Four times a day (QID) | INTRAVENOUS | Status: AC
  Administered 2019-05-08 – 2019-05-09 (×2): 2 g via INTRAVENOUS
  Filled 2019-05-08 (×2): qty 100

## 2019-05-08 MED ORDER — LACTATED RINGERS IV SOLN
INTRAVENOUS | Status: DC
  Administered 2019-05-08 (×2): via INTRAVENOUS

## 2019-05-08 MED ORDER — TRANEXAMIC ACID-NACL 1000-0.7 MG/100ML-% IV SOLN
1000.0000 mg | INTRAVENOUS | Status: AC
  Administered 2019-05-08: 1000 mg via INTRAVENOUS
  Filled 2019-05-08: qty 100

## 2019-05-08 MED ORDER — SODIUM CHLORIDE (PF) 0.9 % IJ SOLN
INTRAMUSCULAR | Status: AC
  Filled 2019-05-08: qty 50

## 2019-05-08 MED ORDER — ASPIRIN 81 MG PO CHEW
81.0000 mg | CHEWABLE_TABLET | Freq: Two times a day (BID) | ORAL | Status: DC
  Administered 2019-05-09: 81 mg via ORAL
  Filled 2019-05-08: qty 1

## 2019-05-08 MED ORDER — CHLORHEXIDINE GLUCONATE 4 % EX LIQD: 60.0000 mL | Freq: Once | CUTANEOUS | Status: DC

## 2019-05-08 MED ORDER — BISACODYL 10 MG RE SUPP: 10.0000 mg | Freq: Every day | RECTAL | Status: DC | PRN

## 2019-05-08 MED ORDER — METOCLOPRAMIDE HCL 5 MG/ML IJ SOLN: 5.0000 mg | Freq: Three times a day (TID) | INTRAMUSCULAR | Status: DC | PRN

## 2019-05-08 MED ORDER — DEXAMETHASONE SODIUM PHOSPHATE 10 MG/ML IJ SOLN
INTRAMUSCULAR | Status: AC
  Filled 2019-05-08: qty 1

## 2019-05-08 MED ORDER — ALUM & MAG HYDROXIDE-SIMETH 200-200-20 MG/5ML PO SUSP: 15.0000 mL | ORAL | Status: DC | PRN

## 2019-05-08 MED ORDER — METHOCARBAMOL 500 MG IVPB - SIMPLE MED
500.0000 mg | Freq: Four times a day (QID) | INTRAVENOUS | Status: DC | PRN
  Filled 2019-05-08: qty 50

## 2019-05-08 MED ORDER — FENTANYL CITRATE (PF) 100 MCG/2ML IJ SOLN
INTRAMUSCULAR | Status: AC
  Filled 2019-05-08: qty 2

## 2019-05-08 MED ORDER — BUPIVACAINE IN DEXTROSE 0.75-8.25 % IT SOLN
INTRATHECAL | Status: DC | PRN
  Administered 2019-05-08: 1.8 mL via INTRATHECAL

## 2019-05-08 MED ORDER — METHOCARBAMOL 500 MG PO TABS
500.0000 mg | ORAL_TABLET | Freq: Four times a day (QID) | ORAL | Status: DC | PRN
  Administered 2019-05-08 – 2019-05-09 (×2): 500 mg via ORAL
  Filled 2019-05-08 (×2): qty 1

## 2019-05-08 MED ORDER — PHENOL 1.4 % MT LIQD: 1.0000 | OROMUCOSAL | Status: DC | PRN

## 2019-05-08 MED ORDER — DEXAMETHASONE SODIUM PHOSPHATE 10 MG/ML IJ SOLN
10.0000 mg | Freq: Once | INTRAMUSCULAR | Status: AC
  Administered 2019-05-08: 10 mg via INTRAVENOUS

## 2019-05-08 MED ORDER — ACETAMINOPHEN 325 MG PO TABS: 325.0000 mg | ORAL_TABLET | Freq: Four times a day (QID) | ORAL | Status: DC | PRN

## 2019-05-08 MED ORDER — HYDROCODONE-ACETAMINOPHEN 7.5-325 MG PO TABS: 1.0000 | ORAL_TABLET | ORAL | Status: DC | PRN

## 2019-05-08 MED ORDER — METFORMIN HCL 500 MG PO TABS: 500.0000 mg | ORAL_TABLET | Freq: Every day | ORAL | Status: DC

## 2019-05-08 MED ORDER — PROPOFOL 10 MG/ML IV BOLUS
INTRAVENOUS | Status: DC | PRN
  Administered 2019-05-08: 10 mg via INTRAVENOUS
  Administered 2019-05-08: 20 mg via INTRAVENOUS

## 2019-05-08 MED ORDER — PROPOFOL 10 MG/ML IV BOLUS
INTRAVENOUS | Status: AC
  Filled 2019-05-08: qty 20

## 2019-05-08 MED ORDER — MAGNESIUM CITRATE PO SOLN: 1.0000 | Freq: Once | ORAL | Status: DC | PRN

## 2019-05-08 MED ORDER — HYDROCODONE-ACETAMINOPHEN 5-325 MG PO TABS
1.0000 | ORAL_TABLET | ORAL | Status: DC | PRN
  Administered 2019-05-08 – 2019-05-09 (×2): 1 via ORAL
  Administered 2019-05-09: 2 via ORAL
  Filled 2019-05-08 (×2): qty 2
  Filled 2019-05-08: qty 1

## 2019-05-08 MED ORDER — PHENYLEPHRINE 40 MCG/ML (10ML) SYRINGE FOR IV PUSH (FOR BLOOD PRESSURE SUPPORT)
PREFILLED_SYRINGE | INTRAVENOUS | Status: DC | PRN
  Administered 2019-05-08: 80 ug via INTRAVENOUS

## 2019-05-08 MED ORDER — PHENYLEPHRINE 40 MCG/ML (10ML) SYRINGE FOR IV PUSH (FOR BLOOD PRESSURE SUPPORT)
PREFILLED_SYRINGE | INTRAVENOUS | Status: AC
  Filled 2019-05-08: qty 10

## 2019-05-08 MED ORDER — FERROUS SULFATE 325 (65 FE) MG PO TABS
325.0000 mg | ORAL_TABLET | Freq: Two times a day (BID) | ORAL | Status: DC
  Administered 2019-05-09: 325 mg via ORAL
  Filled 2019-05-08: qty 1

## 2019-05-08 MED ORDER — MIDAZOLAM HCL 5 MG/5ML IJ SOLN
INTRAMUSCULAR | Status: DC | PRN
  Administered 2019-05-08 (×2): 1 mg via INTRAVENOUS

## 2019-05-08 MED ORDER — CELECOXIB 200 MG PO CAPS
200.0000 mg | ORAL_CAPSULE | Freq: Two times a day (BID) | ORAL | Status: DC
  Administered 2019-05-08 – 2019-05-09 (×2): 200 mg via ORAL
  Filled 2019-05-08 (×2): qty 1

## 2019-05-08 MED ORDER — SODIUM CHLORIDE 0.9 % IV SOLN
INTRAVENOUS | Status: DC
  Administered 2019-05-08: 18:00:00 via INTRAVENOUS

## 2019-05-08 MED ORDER — FENTANYL CITRATE (PF) 100 MCG/2ML IJ SOLN
INTRAMUSCULAR | Status: AC
  Administered 2019-05-08: 100 ug via INTRAVENOUS
  Filled 2019-05-08: qty 2

## 2019-05-08 MED ORDER — ONDANSETRON HCL 4 MG PO TABS: 4.0000 mg | ORAL_TABLET | Freq: Four times a day (QID) | ORAL | Status: DC | PRN

## 2019-05-08 MED ORDER — PROPOFOL 500 MG/50ML IV EMUL
INTRAVENOUS | Status: DC | PRN
  Administered 2019-05-08: 75 ug/kg/min via INTRAVENOUS

## 2019-05-08 MED ORDER — MIDAZOLAM HCL 2 MG/2ML IJ SOLN
INTRAMUSCULAR | Status: AC
  Filled 2019-05-08: qty 2

## 2019-05-08 MED ORDER — DIPHENHYDRAMINE HCL 12.5 MG/5ML PO ELIX: 12.5000 mg | ORAL_SOLUTION | ORAL | Status: DC | PRN

## 2019-05-08 MED ORDER — ONDANSETRON HCL 4 MG/2ML IJ SOLN
INTRAMUSCULAR | Status: AC
  Filled 2019-05-08: qty 2

## 2019-05-08 MED ORDER — FENTANYL CITRATE (PF) 100 MCG/2ML IJ SOLN
50.0000 ug | INTRAMUSCULAR | Status: DC
  Administered 2019-05-08: 100 ug via INTRAVENOUS

## 2019-05-08 MED ORDER — POLYETHYLENE GLYCOL 3350 17 G PO PACK
17.0000 g | PACK | Freq: Two times a day (BID) | ORAL | Status: DC
  Administered 2019-05-08 – 2019-05-09 (×2): 17 g via ORAL
  Filled 2019-05-08 (×2): qty 1

## 2019-05-08 MED ORDER — CEFAZOLIN SODIUM-DEXTROSE 2-4 GM/100ML-% IV SOLN
2.0000 g | INTRAVENOUS | Status: AC
  Administered 2019-05-08: 2 g via INTRAVENOUS
  Filled 2019-05-08: qty 100

## 2019-05-08 MED ORDER — MIDAZOLAM HCL 2 MG/2ML IJ SOLN
1.0000 mg | INTRAMUSCULAR | Status: DC
  Administered 2019-05-08: 14:00:00 2 mg via INTRAVENOUS

## 2019-05-08 MED ORDER — PRAVASTATIN SODIUM 20 MG PO TABS: 40.0000 mg | ORAL_TABLET | Freq: Every day | ORAL | Status: DC

## 2019-05-08 MED ORDER — BUPIVACAINE-EPINEPHRINE (PF) 0.5% -1:200000 IJ SOLN
INTRAMUSCULAR | Status: DC | PRN
  Administered 2019-05-08: 20 mL via PERINEURAL

## 2019-05-08 MED ORDER — 0.9 % SODIUM CHLORIDE (POUR BTL) OPTIME
TOPICAL | Status: DC | PRN
  Administered 2019-05-08: 1000 mL

## 2019-05-08 MED ORDER — ONDANSETRON HCL 4 MG/2ML IJ SOLN
INTRAMUSCULAR | Status: DC | PRN
  Administered 2019-05-08: 4 mg via INTRAVENOUS

## 2019-05-08 MED ORDER — KETOROLAC TROMETHAMINE 30 MG/ML IJ SOLN
INTRAMUSCULAR | Status: AC
  Filled 2019-05-08: qty 1

## 2019-05-08 MED ORDER — HYDROMORPHONE HCL 1 MG/ML IJ SOLN: 0.2500 mg | INTRAMUSCULAR | Status: DC | PRN

## 2019-05-08 MED ORDER — BUPIVACAINE-EPINEPHRINE (PF) 0.25% -1:200000 IJ SOLN
INTRAMUSCULAR | Status: AC
  Filled 2019-05-08: qty 30

## 2019-05-08 SURGICAL SUPPLY — 43 items
BAG ZIPLOCK 12X15 (MISCELLANEOUS) ×2 IMPLANT
BIT DRILL 2.8X128 (BIT) ×2 IMPLANT
BLADE SAW SGTL 11.0X1.19X90.0M (BLADE) IMPLANT
BNDG ELASTIC 6X5.8 VLCR STR LF (GAUZE/BANDAGES/DRESSINGS) ×2 IMPLANT
COVER SURGICAL LIGHT HANDLE (MISCELLANEOUS) ×2 IMPLANT
COVER WAND RF STERILE (DRAPES) IMPLANT
CUFF TOURN SGL QUICK 34 (TOURNIQUET CUFF)
CUFF TRNQT CYL 34X4.125X (TOURNIQUET CUFF) IMPLANT
DERMABOND ADVANCED (GAUZE/BANDAGES/DRESSINGS) ×1
DERMABOND ADVANCED .7 DNX12 (GAUZE/BANDAGES/DRESSINGS) ×1 IMPLANT
DRSG AQUACEL AG ADV 3.5X10 (GAUZE/BANDAGES/DRESSINGS) ×2 IMPLANT
DURAPREP 26ML APPLICATOR (WOUND CARE) ×2 IMPLANT
ELECT REM PT RETURN 15FT ADLT (MISCELLANEOUS) ×2 IMPLANT
GLOVE BIOGEL PI IND STRL 7.5 (GLOVE) ×1 IMPLANT
GLOVE BIOGEL PI IND STRL 8.5 (GLOVE) ×1 IMPLANT
GLOVE BIOGEL PI INDICATOR 7.5 (GLOVE) ×1
GLOVE BIOGEL PI INDICATOR 8.5 (GLOVE) ×1
GLOVE ECLIPSE 8.0 STRL XLNG CF (GLOVE) ×2 IMPLANT
GLOVE ORTHO TXT STRL SZ7.5 (GLOVE) ×4 IMPLANT
GOWN STRL REUS W/TWL LRG LVL3 (GOWN DISPOSABLE) ×2 IMPLANT
GOWN STRL REUS W/TWL XL LVL3 (GOWN DISPOSABLE) ×2 IMPLANT
HOLDER FOLEY CATH W/STRAP (MISCELLANEOUS) ×2 IMPLANT
IMMOBILIZER KNEE 20 (SOFTGOODS) ×2
IMMOBILIZER KNEE 20 THIGH 36 (SOFTGOODS) ×1 IMPLANT
KIT TURNOVER KIT A (KITS) IMPLANT
MANIFOLD NEPTUNE II (INSTRUMENTS) ×2 IMPLANT
NS IRRIG 1000ML POUR BTL (IV SOLUTION) ×2 IMPLANT
PACK TOTAL KNEE CUSTOM (KITS) ×2 IMPLANT
PASSER SUT SWANSON 36MM LOOP (INSTRUMENTS) ×2 IMPLANT
PROTECTOR NERVE ULNAR (MISCELLANEOUS) ×2 IMPLANT
SUT FIBERWIRE #2 38 T-5 BLUE (SUTURE) ×4
SUT MNCRL AB 3-0 PS2 18 (SUTURE) ×2 IMPLANT
SUT MNCRL AB 4-0 PS2 18 (SUTURE) ×2 IMPLANT
SUT VIC AB 0 CT1 27 (SUTURE) ×1
SUT VIC AB 0 CT1 27XBRD ANTBC (SUTURE) ×1 IMPLANT
SUT VIC AB 1 CT1 36 (SUTURE) ×4 IMPLANT
SUT VIC AB 2-0 CT1 27 (SUTURE) ×3
SUT VIC AB 2-0 CT1 TAPERPNT 27 (SUTURE) ×3 IMPLANT
SUTURE FIBERWR #2 38 T-5 BLUE (SUTURE) ×2 IMPLANT
TOWEL OR 17X26 10 PK STRL BLUE (TOWEL DISPOSABLE) IMPLANT
TRAY FOLEY MTR SLVR 14FR STAT (SET/KITS/TRAYS/PACK) ×2 IMPLANT
WRAP KNEE MAXI GEL POST OP (GAUZE/BANDAGES/DRESSINGS) ×2 IMPLANT
YANKAUER SUCT BULB TIP 10FT TU (MISCELLANEOUS) ×2 IMPLANT

## 2019-05-08 NOTE — Progress Notes (Signed)
Assisted Dr. Edmond Fitzgerald with left, ultrasound guided, adductor canal block. Side rails up, monitors on throughout procedure. See vital signs in flow sheet. Tolerated Procedure well. 

## 2019-05-08 NOTE — Op Note (Signed)
NAME: Jason Obrien, Jason Obrien MEDICAL RECORD ZO:[id number] ACCOUNT [id number] DATE OF BIRTH:[birth year] FACILITY: [facility] LOCATION: [facility] PHYSICIAN:Blonnie Maske D. Smantha Boakye, MD  OPERATIVE REPORT  DATE OF PROCEDURE:  05/08/2019  PREOPERATIVE DIAGNOSIS:  Left quadriceps tendon rupture at the patella with avulsion.  POSTOPERATIVE DIAGNOSIS:  Left quadriceps tendon rupture at the patella with avulsion.  PROCEDURE:  Open quadriceps tendon repair to bone utilizing #2 FiberWire sutures x2 were passed through the bone through 3 drill holes and sutured together.  He was noted to have significant medial and lateral retinacular tears, which were repaired with  #1 Vicryl.  SURGEON:  Paralee Cancel, MD  ASSISTANT:  Griffith Citron, PA-C.  Note that Ms. Nehemiah Settle was present for the entirety of the case from preoperative position, perioperative management of the operative extremity, general facilitation of the case and primary wound closure.  ANESTHESIA:  Regional plus spinal block.  SPECIMENS:  None.  COMPLICATIONS:  None.  TOURNIQUET:  Up for 39 minutes at 250 mmHg.  INDICATIONS FOR PROCEDURE:  The patient is a pleasant 68 year old male who presented to the office last week after slipping on some pine straw outside his house.  He had a hyperflexion injury.  He had immediate onset of pain, deformity, and inability to  bear weight.  He was brought to the emergency room where exam revealed concern for quadriceps tendon rupture.  He was seen in the office when these findings were confirmed.  MRI was ordered and did not reveal any further intra-articular pathology, no  chondral delaminations.  Quadriceps tendon was confirmed.  We discussed the indications for surgical repair and the necessity for functional return.  We discussed the postoperative course and expectations.  Consent was obtained for benefit of improved  function and repair of his tendon.  DESCRIPTION OF PROCEDURE:  The patient was brought to the  operative theater.  Once adequate anesthesia, preoperative antibiotics, Ancef administered, he was positioned supine with a left thigh tourniquet placed.  The left lower extremity was then prepped  and draped in sterile fashion.  A timeout was performed identifying the patient, the planned procedure and extremity.  Midline incision was made to expose the distal aspect new onset ____ as well as the area of the inferior pole of the patella for  suture repair.  The soft tissue dissection was carried down to the extensor mechanism and this was then exposed.  Significant tear of the quadriceps tendon identified with retinacular tears medially and laterally.  Large hematoma was evacuated from the  knee.  Once this was done, I examined the knee underneath the patella and did not see any damage.  We irrigated the knee with normal saline solution.  I then removed soft tissue and some of the bone off the end of the proximal pole of patella to allow  for better blood supply for potential cement or for potential healing of the tendon to bone.  I then minimally prepared the distal aspect of the quadriceps tendon.  I then passed two #2 FiberWire sutures in a [place] weave pattern in the medial and  lateral aspects of the distal quadriceps tendon.  There were now 4 strands out the distal end.  I then created 3 drill holes in the patella.  We passed the center 2 suture ends through the center hole and the lateral and medial ones through the  respective drill holes in the patella.  I then passed the sutures to the midline.  The medial 2 sutures were pulled tight while we applied  proximal traction to the patella, bringing the quadriceps tendon down to bone.  I sutured down the lateral  structures to make certain there was direct bone to tendon contact.  I then did the similar repair on the medial side.  These 2 then were sutured together.  Once this was completed, I used #1 Vicryl to reapproximate the medial and lateral  retinacular  tissues.  There was no tendon available on the quadrant on the patella for me to oversew the area that was repaired by the FiberWire suture.  I then irrigated the wound.  The tourniquet was let down after 39 minutes.  The remainder of the wound was  closed with 2-0 Vicryl and a running Monocryl stitch.  The skin was then cleaned, dried and dressed sterilely using surgical glue and Aquacel dressing.  The patient was brought to the recovery room in stable condition, tolerating the procedure well.  He  will be in a knee immobilizer.  We will order T-ROM brace to be locked in extension prior to discharge tomorrow.  He will be weightbearing as tolerated.  He will have physical therapy to make certain that he has all questions addressed.  TN/NUANCE  D:05/08/2019 T:05/08/2019 JOB:[id number]

## 2019-05-08 NOTE — Transfer of Care (Signed)
Immediate Anesthesia Transfer of Care Note  Patient: Jason Obrien  Procedure(s) Performed: REPAIR QUADRICEP TENDON (Left )  Patient Location: PACU  Anesthesia Type:Spinal and MAC combined with regional for post-op pain  Level of Consciousness: awake, alert , oriented and patient cooperative  Airway & Oxygen Therapy: Patient Spontanous Breathing and Patient connected to face mask oxygen  Post-op Assessment: Report given to RN and Post -op Vital signs reviewed and stable  Post vital signs: Reviewed and stable  Last Vitals:  Vitals Value Taken Time  BP    Temp    Pulse    Resp    SpO2      Last Pain:  Vitals:   05/08/19 1345  TempSrc: Oral      Patients Stated Pain Goal: 4 (32/35/57 3220)  Complications: No apparent anesthesia complications

## 2019-05-08 NOTE — Anesthesia Postprocedure Evaluation (Signed)
Anesthesia Post Note  Patient: Jason Obrien  Procedure(s) Performed: REPAIR QUADRICEP TENDON (Left )     Patient location during evaluation: PACU Anesthesia Type: Spinal and Regional Level of consciousness: oriented and awake and alert Pain management: pain level controlled Vital Signs Assessment: post-procedure vital signs reviewed and stable Respiratory status: spontaneous breathing, respiratory function stable and patient connected to nasal cannula oxygen Cardiovascular status: blood pressure returned to baseline and stable Postop Assessment: no headache, no backache, no apparent nausea or vomiting and spinal receding Anesthetic complications: no    Last Vitals:  Vitals:   05/08/19 1715 05/08/19 1730  BP: 123/87 120/80  Pulse: 66 68  Resp: (!) 22 20  Temp:    SpO2: 100% 100%    Last Pain:  Vitals:   05/08/19 1730  TempSrc:   PainSc: 0-No pain                 [name],W. EDMOND

## 2019-05-08 NOTE — Anesthesia Procedure Notes (Signed)
Anesthesia Regional Block: Adductor canal block   Pre-Anesthetic Checklist: ,, timeout performed, Correct Patient, Correct Site, Correct Laterality, Correct Procedure, Correct Position, site marked, Risks and benefits discussed, pre-op evaluation,  At surgeon's request and post-op pain management  Laterality: Left  Prep: Maximum Sterile Barrier Precautions used, chloraprep       Needles:  Injection technique: Single-shot  Needle Type: Echogenic Stimulator Needle     Needle Length: 9cm  Needle Gauge: 21     Additional Needles:   Procedures:,,,, ultrasound used (permanent image in chart),,,,  Narrative:  Start time: 05/08/2019 2:04 PM End time: 05/08/2019 2:14 PM Injection made incrementally with aspirations every 5 mL.  Performed by: Personally  Anesthesiologist: Roderic Palau, MD  Additional Notes: 2% Lidocaine skin wheel.

## 2019-05-08 NOTE — Brief Op Note (Signed)
05/08/2019  3:17 PM  PATIENT:  Jason Obrien  68 y.o. male  PRE-OPERATIVE DIAGNOSIS:  Left quadricept tendon rupture  POST-OPERATIVE DIAGNOSIS:  Left quadricept tendon rupture  PROCEDURE:  Procedure(s) with comments: REPAIR QUADRICEP TENDON (Left) - 90 mins  SURGEON:  Surgeon(s) and Role:    Paralee Cancel, MD - Primary  PHYSICIAN ASSISTANT: Griffith Citron, PA-C  ANESTHESIA:   regional and spinal  EBL:  <100cc cc  BLOOD ADMINISTERED:none  DRAINS: none   LOCAL MEDICATIONS USED:  NONE  SPECIMEN:  No Specimen  DISPOSITION OF SPECIMEN:  PATHOLOGY  COUNTS:  YES  TOURNIQUET:  39 min at 250 mmHG  DICTATION: .Other Dictation: Dictation Number [id number]  PLAN OF CARE: Admit for overnight observation  PATIENT DISPOSITION:  PACU - hemodynamically stable.   Delay start of Pharmacological VTE agent (>24hrs) due to surgical blood loss or risk of bleeding: no

## 2019-05-08 NOTE — Anesthesia Procedure Notes (Signed)
Procedure Name: MAC Date/Time: 05/08/2019 2:56 PM Performed by: West Pugh, CRNA Pre-anesthesia Checklist: Patient identified, Emergency Drugs available, Suction available, Patient being monitored and Timeout performed Patient Re-evaluated:Patient Re-evaluated prior to induction Oxygen Delivery Method: Simple face mask Preoxygenation: Pre-oxygenation with 100% oxygen Induction Type: IV induction Placement Confirmation: positive ETCO2 Dental Injury: Teeth and Oropharynx as per pre-operative assessment

## 2019-05-08 NOTE — Interval H&P Note (Signed)
History and Physical Interval Note:  05/08/2019 1:52 PM  Jason Obrien  has presented today for surgery, with the diagnosis of tendon tear.  The various methods of treatment have been discussed with the patient and family. After consideration of risks, benefits and other options for treatment, the patient has consented to  Procedure(s) with comments: REPAIR QUADRICEP TENDON (Left) - 90 mins as a surgical intervention.  The patient's history has been reviewed, patient examined, no change in status, stable for surgery.  I have reviewed the patient's chart and labs.  Questions were answered to the patient's satisfaction.     Mauri Pole

## 2019-05-08 NOTE — Anesthesia Preprocedure Evaluation (Addendum)
Anesthesia Evaluation  Patient identified by MRN, date of birth, ID band Patient awake    Reviewed: Allergy & Precautions, H&P , NPO status , Patient's Chart, lab work & pertinent test results  Airway Mallampati: II  TM Distance: >3 FB Neck ROM: Full    Dental no notable dental hx. (+) Teeth Intact, Dental Advisory Given   Pulmonary neg pulmonary ROS,    Pulmonary exam normal breath sounds clear to auscultation       Cardiovascular negative cardio ROS   Rhythm:Regular Rate:Normal     Neuro/Psych negative neurological ROS  negative psych ROS   GI/Hepatic negative GI ROS, Neg liver ROS,   Endo/Other  negative endocrine ROS  Renal/GU negative Renal ROS  negative genitourinary   Musculoskeletal  (+) Arthritis , Osteoarthritis,    Abdominal   Peds  Hematology negative hematology ROS (+)   Anesthesia Other Findings   Reproductive/Obstetrics negative OB ROS                            Anesthesia Physical Anesthesia Plan  ASA: II  Anesthesia Plan: Spinal   Post-op Pain Management:  Regional for Post-op pain   Induction: Intravenous  PONV Risk Score and Plan: 2 and Ondansetron, Dexamethasone, Propofol infusion and Midazolam  Airway Management Planned: Simple Face Mask  Additional Equipment:   Intra-op Plan:   Post-operative Plan:   Informed Consent: I have reviewed the patients History and Physical, chart, labs and discussed the procedure including the risks, benefits and alternatives for the proposed anesthesia with the patient or authorized representative who has indicated his/her understanding and acceptance.     Dental advisory given  Plan Discussed with: CRNA  Anesthesia Plan Comments:         Anesthesia Quick Evaluation

## 2019-05-08 NOTE — Anesthesia Procedure Notes (Signed)
Spinal  Patient location during procedure: OR Start time: 05/08/2019 3:06 PM End time: 05/08/2019 3:09 PM Staffing Anesthesiologist: Roderic Palau, MD Performed: anesthesiologist  Preanesthetic Checklist Completed: patient identified, surgical consent, pre-op evaluation, timeout performed, IV checked, risks and benefits discussed and monitors and equipment checked Spinal Block Patient position: sitting Prep: DuraPrep Patient monitoring: cardiac monitor, continuous pulse ox and blood pressure Approach: midline Location: L3-4 Injection technique: single-shot Needle Needle type: Pencan  Needle gauge: 24 G Needle length: 9 cm Assessment Sensory level: T8 Additional Notes Functioning IV was confirmed and monitors were applied. Sterile prep and drape, including hand hygiene and sterile gloves were used. The patient was positioned and the spine was prepped. The skin was anesthetized with lidocaine.  Free flow of clear CSF was obtained prior to injecting local anesthetic into the CSF.  The spinal needle aspirated freely following injection.  The needle was carefully withdrawn.  The patient tolerated the procedure well. CRNA attempted prior to attempt by MD.

## 2019-05-09 ENCOUNTER — Encounter (HOSPITAL_COMMUNITY): Payer: Self-pay | Admitting: Orthopedic Surgery

## 2019-05-09 DIAGNOSIS — S76112A Strain of left quadriceps muscle, fascia and tendon, initial encounter: Secondary | ICD-10-CM | POA: Diagnosis not present

## 2019-05-09 LAB — GLUCOSE, CAPILLARY: Glucose-Capillary: 115 mg/dL — ABNORMAL HIGH (ref 70–99)

## 2019-05-09 LAB — BASIC METABOLIC PANEL
Anion gap: 7 (ref 5–15)
BUN: 19 mg/dL (ref 8–23)
CO2: 25 mmol/L (ref 22–32)
Calcium: 8.4 mg/dL — ABNORMAL LOW (ref 8.9–10.3)
Chloride: 104 mmol/L (ref 98–111)
Creatinine, Ser: 0.62 mg/dL (ref 0.61–1.24)
GFR calc Af Amer: >60 mL/min (ref >60)
GFR calc non Af Amer: >60 mL/min (ref >60)
Glucose, Bld: 149 mg/dL — ABNORMAL HIGH (ref 70–99)
Potassium: 4.4 mmol/L (ref 3.5–5.1)
Sodium: 136 mmol/L (ref 135–145)

## 2019-05-09 LAB — CBC
HCT: 32.2 % — ABNORMAL LOW (ref 39.0–52.0)
Hemoglobin: 10.3 g/dL — ABNORMAL LOW (ref 13.0–17.0)
MCH: 32.5 pg (ref 26.0–34.0)
MCHC: 32 g/dL (ref 30.0–36.0)
MCV: 101.6 fL — ABNORMAL HIGH (ref 80.0–100.0)
Platelets: 288 10*3/uL (ref 150–400)
RBC: 3.17 MIL/uL — ABNORMAL LOW (ref 4.22–5.81)
RDW: 12.7 % (ref 11.5–15.5)
WBC: 5.4 10*3/uL (ref 4.0–10.5)
nRBC: 0 % (ref 0.0–0.2)

## 2019-05-09 MED ORDER — FERROUS SULFATE 325 (65 FE) MG PO TABS: 325.0000 mg | ORAL_TABLET | Freq: Three times a day (TID) | ORAL | 0 refills | Status: DC

## 2019-05-09 MED ORDER — POLYETHYLENE GLYCOL 3350 17 G PO PACK: 17.0000 g | PACK | Freq: Two times a day (BID) | ORAL | 0 refills | Status: DC

## 2019-05-09 MED ORDER — ASPIRIN 81 MG PO CHEW: 81.0000 mg | CHEWABLE_TABLET | Freq: Two times a day (BID) | ORAL | 0 refills | Status: AC

## 2019-05-09 MED ORDER — DOCUSATE SODIUM 100 MG PO CAPS: 100.0000 mg | ORAL_CAPSULE | Freq: Two times a day (BID) | ORAL | 0 refills | Status: DC

## 2019-05-09 MED ORDER — METHOCARBAMOL 500 MG PO TABS: 500.0000 mg | ORAL_TABLET | Freq: Four times a day (QID) | ORAL | 0 refills | Status: DC | PRN

## 2019-05-09 MED ORDER — HYDROCODONE-ACETAMINOPHEN 7.5-325 MG PO TABS: 1.0000 | ORAL_TABLET | ORAL | 0 refills | Status: DC | PRN

## 2019-05-09 NOTE — Evaluation (Signed)
Physical Therapy Evaluation Patient Details Name: Jason Obrien MRN: [id number] DOB: October 30, 1950 Today's Date: 05/09/2019   History of Present Illness  Pt is a 68 y.o. male complaining of left knee pain since 04/29/2019. On that date the patient slipped on some grass and the leg folded under him.  He immediately had issues with the lg and needed assistance in getting up. Imaging of the left knee reveals a left quad tendon rupture. He is now s/p quad tendon repair.    Clinical Impression  Jason Obrien is a 68 y.o. male POD1 s/p LT quad tendon repair. He is limited by functional impairments below (see PT Problem List) and must wear bledso brace locked in extension at all times for Lt knee. Patient was able to complete bed mobility at Mod I level and required supervision for sit to stand transfers and gait with RW. He was instructed on safe stair negotiation with step to pattern going forward and sideways. Patient demonstrated good sequencing and min guard provided for safety. He has reported since injuring his Lt knee he has been using the walker to go up/down his single step at home with no difficulty to enter his house and declined to perform this. He has demonstrated safe mobility with use of RW, and has a good support system at home. Patient is safe for discharge when medically ready and Acute PT will continue to follow throughout his duration.   Follow Up Recommendations Follow surgeon's recommendation for DC plan and follow-up therapies    Equipment Recommendations  None recommended by PT    Recommendations for Other Services       Precautions / Restrictions Precautions Precautions: Fall;Knee Precaution Comments: pt to remain in T brace locked in extension at all times, no active or passive knee flexion Required Braces or Orthoses: Knee Immobilizer - Left Knee Immobilizer - Left: On at all times Restrictions Weight Bearing Restrictions: No      Mobility  Bed Mobility Overal bed  mobility: Modified Independent             General bed mobility comments: flat bed, use of bed rials  Transfers Overall transfer level: Needs assistance   Transfers: Sit to/from Stand Sit to Stand: Supervision;From elevated surface         General transfer comment: bed slightly elevated, and pt performed wtih supervision from bedside recliner  Ambulation/Gait Ambulation/Gait assistance: Supervision Gait Distance (Feet): 200 Feet Assistive device: Rolling walker (2 wheeled) Gait Pattern/deviations: Step-to pattern;Decreased step length - left;Decreased stance time - left;Decreased stride length;Trunk flexed Gait velocity: slow   General Gait Details: pt required cues to roll walker rather than lift like a standard walker, no additional cues needed for safety, pt requried intermittent cues to look up and improve posture  Stairs Stairs: Yes Stairs assistance: Min guard Stair Management: Two rails;One rail Left;Forwards;Sideways;Step to pattern Number of Stairs: 6 General stair comments: 1x 3 steps to ascend /descend stairs with step to pattern going forward with 2 hand rails, and 1x 3 steps for step to pattern side stepping with Lt hand rails  Wheelchair Mobility    Modified Rankin (Stroke Patients Only)       Balance Overall balance assessment: Needs assistance Sitting-balance support: Feet supported;No upper extremity supported Sitting balance-Leahy Scale: Good     Standing balance support: Bilateral upper extremity supported;During functional activity Standing balance-Leahy Scale: Fair Standing balance comment: pt required UE support for dynamic standing, able to don face mask with no UE support in  static standing            Pertinent Vitals/Pain Pain Assessment: No/denies pain    Home Living Family/patient expects to be discharged to:: Private residence Living Arrangements: Spouse/significant other Available Help at Discharge: Family Type of Home:  House Home Access: Stairs to enter Entrance Stairs-Rails: Left Entrance Stairs-Number of Steps: 1 step Home Layout: Two level;Bed/bath upstairs Home Equipment: Walker - 2 wheels;Crutches;Walker - standard Additional Comments: patietn reports they can get any needed equipment from the donation center at his church    Prior Function Level of Independence: Independent           Hand Dominance        Extremity/Trunk Assessment   Upper Extremity Assessment Upper Extremity Assessment: Overall WFL for tasks assessed    Lower Extremity Assessment Lower Extremity Assessment: LLE deficits/detail RLE Deficits / Details: pt able to perform singge limb sit to stand from elevated EOB and from bedside recliner LLE: Unable to fully assess due to immobilization    Cervical / Trunk Assessment Cervical / Trunk Assessment: Normal  Communication   Communication: No difficulties  Cognition Arousal/Alertness: Awake/alert Behavior During Therapy: WFL for tasks assessed/performed Overall Cognitive Status: Within Functional Limits for tasks assessed                        Assessment/Plan    PT Assessment Patient needs continued PT services  PT Problem List Decreased strength;Decreased activity tolerance;Decreased mobility;Decreased balance;Decreased range of motion;Decreased knowledge of use of DME       PT Treatment Interventions DME instruction;Stair training;Therapeutic activities;Balance training;Patient/family education;Therapeutic exercise;Functional mobility training;Gait training    PT Goals (Current goals can be found in the Care Plan section)  Acute Rehab PT Goals Patient Stated Goal: to return home with wife PT Goal Formulation: With patient Time For Goal Achievement: 05/12/19 Potential to Achieve Goals: Good    Frequency 7X/week    AM-PAC PT "6 Clicks" Mobility  Outcome Measure Help needed turning from your back to your side while in a flat bed without using  bedrails?: A Little Help needed moving from lying on your back to sitting on the side of a flat bed without using bedrails?: A Little Help needed moving to and from a bed to a chair (including a wheelchair)?: A Little Help needed standing up from a chair using your arms (e.g., wheelchair or bedside chair)?: A Little Help needed to walk in hospital room?: A Little Help needed climbing 3-5 steps with a railing? : A Little 6 Click Score: 18    End of Session Equipment Utilized During Treatment: Gait belt;Left knee immobilizer Activity Tolerance: Patient tolerated treatment well Patient left: in chair;with call bell/phone within reach Nurse Communication: Mobility status PT Visit Diagnosis: Unsteadiness on feet (R26.81);Difficulty in walking, not elsewhere classified (R26.2)    Time: 1200-1228 PT Time Calculation (min) (ACUTE ONLY): 28 min   Charges:   PT Evaluation $PT Eval Low Complexity: 1 Low PT Treatments $Gait Training: 8-22 mins      Kipp Brood, PT, DPT, [facility] Physical Therapist with [facility]  05/09/2019 1:59 PM

## 2019-05-09 NOTE — Plan of Care (Signed)
  Problem: Acute Rehab PT Goals(only PT should resolve) Goal: Patient Will Transfer Sit To/From Stand Outcome: Progressing Flowsheets (Taken 05/09/2019 1400) Patient will transfer sit to/from stand: with modified independence Goal: Pt Will Transfer Bed To Chair/Chair To Bed Outcome: Progressing Flowsheets (Taken 05/09/2019 1400) Pt will Transfer Bed to Chair/Chair to Bed: with modified independence Goal: Pt Will Ambulate Outcome: Progressing Flowsheets (Taken 05/09/2019 1400) Pt will Ambulate:  > 125 feet  with modified independence Goal: Pt Will Go Up/Down Stairs Outcome: Progressing Flowsheets (Taken 05/09/2019 1400) Pt will Go Up / Down Stairs:  3-5 stairs  with supervision

## 2019-05-09 NOTE — Progress Notes (Signed)
     Subjective: 1 Day Post-Op Procedure(s) (LRB): REPAIR QUADRICEP TENDON (Left)   Patient reports pain as mild, pain controlled. No events throughout the night.Dr. Alvan Dame discussed WBAT, but no flexion of the left knee.  Will order t-ROM brace locked in extension.  Ready to be discharged home after getting the T-ROM brace and safe to d/c home.     Objective:   VITALS:   Vitals:   05/09/19 0105 05/09/19 0424  BP: 134/75 127/74  Pulse: 76 68  Resp: 18 14  Temp: 98.5 F (36.9 C) 98 F (36.7 C)  SpO2: 99% 99%    Dorsiflexion/Plantar flexion intact Incision: dressing C/D/I No cellulitis present Compartment soft  LABS Recent Labs    05/07/19 1116 05/09/19 0304  HGB 11.9* 10.3*  HCT 37.5* 32.2*  WBC 6.1 5.4  PLT 281 288    Recent Labs    05/07/19 1116 05/09/19 0304  NA 139 136  K 4.5 4.4  BUN 22 19  CREATININE 0.81 0.62  GLUCOSE 119* 149*     Assessment/Plan: 1 Day Post-Op Procedure(s) (LRB): REPAIR QUADRICEP TENDON (Left) Foley cath d/c'ed Advance diet Up with therapy D/C IV fluids Discharge home with home health Follow up in 2 weeks at [facility] Follow up with OLIN,Illya Gienger D in 2 weeks.  Contact information:  [facility] [address] [place] [phone]    Obese (BMI 30-39.9) Estimated body mass index is 36.79 kg/m as calculated from the following:   Height as of this encounter: 5\' 9"  (1.753 m).   Weight as of this encounter: 113 kg. Patient also counseled that weight may inhibit the healing process Patient counseled that losing weight will help with future health issues          West Pugh. Madalin Hughart   PAC  05/09/2019, 8:19 AM

## 2019-05-09 NOTE — Progress Notes (Signed)
Patient discharged to home w/ family. Given all belongings, instructions, equipment. Verbalized understanding of all instructions. Escorted to pov via w/c. 

## 2019-05-14 DIAGNOSIS — Z4789 Encounter for other orthopedic aftercare: Secondary | ICD-10-CM | POA: Diagnosis not present

## 2019-05-14 NOTE — Discharge Summary (Signed)
Physician Discharge Summary  Patient ID: Jason Obrien MRN: [id number] DOB/AGE: Aug 26, 1951 68 y.o.  Admit date: 05/08/2019 Discharge date: 05/09/2019   Procedures:  Procedure(s) (LRB): REPAIR QUADRICEP TENDON (Left)  Attending Physician:  Dr. Paralee Cancel   Admission Diagnoses:   Left quad tendon rupture  Discharge Diagnoses:  Active Problems:   Left quad rutpure  Past Medical History:  Diagnosis Date  . Arthritis   . Cancer [place])    Prostate Cancer for SX in Dec.2017  . Chicken pox    childhood  . History of kidney stones 03/2018  . Kidney stones    In 2013 or 2014  . Pre-diabetes     HPI:    Jason Obrien is a 68 y.o. male complaining of left knee pain since 04/29/2019. On that date the patient slipped on some grass and the leg folded under him.  He immediately had issues with the lg and needed assistance in getting up.  Pain has been stable since the beginning, but difficulty standing on the left leg. Imaging of the left knee reveals a left quad tendon rupture.  Various options are discussed with the patient and he wishes to proceed with a repair of the quad tendon. Risks, benefits and expectations were discussed with the patient. Patient understand the risks, benefits and expectations and wishes to proceed with surgery.   PCP: Darreld Mclean, MD   Discharged Condition: good  Hospital Course:  Patient underwent the above stated procedure on 05/08/2019. Patient tolerated the procedure well and brought to the recovery room in good condition and subsequently to the floor.  POD #1 BP: 127/74 ; Pulse: 68 ; Temp: 98 F (36.7 C) ; Resp: 14 Patient reports pain as mild, pain controlled. No events throughout the night.Dr. Alvan Dame discussed WBAT, but no flexion of the left knee.  Will order t-ROM brace locked in extension.  Ready to be discharged home. Dorsiflexion/plantar flexion intact, incision: dressing C/D/I, no cellulitis present and compartment soft.   LABS  Basename    HGB      10.3  HCT     32.2    Discharge Exam: General appearance: alert, cooperative and no distress Extremities: Homans sign is negative, no sign of DVT, no edema, redness or tenderness in the calves or thighs and no ulcers, gangrene or trophic changes  Disposition:  Home with follow up in 2 weeks   Follow-up Information    Paralee Cancel, MD. Schedule an appointment as soon as possible for a visit in 2 weeks.   Specialty: Orthopedic Surgery Contact information: [address] [place] [address] [phone]           Discharge Instructions    Call MD / Call 911   Complete by: As directed    If you experience chest pain or shortness of breath, CALL 911 and be transported to the hospital emergency room.  If you develope a fever above 101 F, pus (white drainage) or increased drainage or redness at the wound, or calf pain, call your surgeon's office.   Change dressing   Complete by: As directed    Maintain surgical dressing until follow up in the clinic. If the edges start to pull up, may reinforce with tape. If the dressing is no longer working, may remove and cover with gauze and tape, but must keep the area dry and clean.  Call with any questions or concerns.   Constipation Prevention   Complete by: As directed  Drink plenty of fluids.  Prune juice may be helpful.  You may use a stool softener, such as Colace (over the counter) 100 mg twice a day.  Use MiraLax (over the counter) for constipation as needed.   Diet - low sodium heart healthy   Complete by: As directed    Discharge instructions   Complete by: As directed    Maintain surgical dressing until follow up in the clinic. If the edges start to pull up, may reinforce with tape. If the dressing is no longer working, may remove and cover with gauze and tape, but must keep the area dry and clean.  Follow up in 2 weeks at [facility]. Call with any questions or concerns.   Increase activity slowly as  tolerated   Complete by: As directed    Weight bearing as tolerated with assist device (walker, cane, etc) as directed, use it as long as suggested by your surgeon or therapist, typically at least 4-6 weeks.  No flexion of the knee.   TED hose   Complete by: As directed    Use stockings (TED hose) for 2 weeks on both leg(s).  You may remove them at night for sleeping.   Weight bearing as tolerated   Complete by: As directed    No flexion.  Lock in extension.   Laterality: left   Extremity: Lower      Allergies as of 05/09/2019   No Known Allergies     Medication List    STOP taking these medications   Bayer Aspirin EC Low Dose 81 MG EC tablet Generic drug: aspirin Replaced by: aspirin 81 MG chewable tablet   ibuprofen 200 MG tablet Commonly known as: ADVIL     TAKE these medications   aspirin 81 MG chewable tablet Commonly known as: Aspirin Childrens Chew 1 tablet (81 mg total) by mouth 2 (two) times daily. Take for 4 weeks, then resume regular dose. Replaces: Bayer Aspirin EC Low Dose 81 MG EC tablet   BIOFLEX PO Take 1 tablet by mouth daily.   docusate sodium 100 MG capsule Commonly known as: Colace Take 1 capsule (100 mg total) by mouth 2 (two) times daily.   ferrous sulfate 325 (65 FE) MG tablet Commonly known as: FerrouSul Take 1 tablet (325 mg total) by mouth 3 (three) times daily with meals for 14 days.   Fish Oil Ultra 1400 MG Caps Take 1,400 mg by mouth daily.   HYDROcodone-acetaminophen 7.5-325 MG tablet Commonly known as: Norco Take 1-2 tablets by mouth every 4 (four) hours as needed for moderate pain.   lovastatin 40 MG tablet Commonly known as: MEVACOR Take 1 tablet (40 mg total) by mouth daily. What changed: when to take this   metFORMIN 500 MG tablet Commonly known as: GLUCOPHAGE Take 1 tablet (500 mg total) by mouth daily. What changed: when to take this   methocarbamol 500 MG tablet Commonly known as: Robaxin Take 1 tablet (500 mg total)  by mouth every 6 (six) hours as needed for muscle spasms.   ONE-A-DAY MENS 50+ ADVANTAGE PO Take 1 capsule by mouth daily.   polyethylene glycol 17 g packet Commonly known as: MIRALAX / GLYCOLAX Take 17 g by mouth 2 (two) times daily.            Discharge Care Instructions  (From admission, onward)         Start     Ordered   05/09/19 0000  Change dressing    Comments:  Maintain surgical dressing until follow up in the clinic. If the edges start to pull up, may reinforce with tape. If the dressing is no longer working, may remove and cover with gauze and tape, but must keep the area dry and clean.  Call with any questions or concerns.   05/09/19 0833   05/09/19 0000  Weight bearing as tolerated    Comments: No flexion.  Lock in extension.  Question Answer Comment  Laterality left   Extremity Lower      05/09/19 0833           Signed: West Pugh. Adelae Yodice   PA-C  05/14/2019, 7:56 AM

## 2019-06-14 DIAGNOSIS — Z4789 Encounter for other orthopedic aftercare: Secondary | ICD-10-CM | POA: Diagnosis not present

## 2019-07-04 DIAGNOSIS — R69 Illness, unspecified: Secondary | ICD-10-CM | POA: Diagnosis not present

## 2019-07-04 DIAGNOSIS — M25562 Pain in left knee: Secondary | ICD-10-CM | POA: Diagnosis not present

## 2019-07-04 DIAGNOSIS — M25662 Stiffness of left knee, not elsewhere classified: Secondary | ICD-10-CM | POA: Diagnosis not present

## 2019-07-06 DIAGNOSIS — M25562 Pain in left knee: Secondary | ICD-10-CM | POA: Diagnosis not present

## 2019-07-06 DIAGNOSIS — M25662 Stiffness of left knee, not elsewhere classified: Secondary | ICD-10-CM | POA: Diagnosis not present

## 2019-07-10 DIAGNOSIS — M25562 Pain in left knee: Secondary | ICD-10-CM | POA: Diagnosis not present

## 2019-07-10 DIAGNOSIS — M25662 Stiffness of left knee, not elsewhere classified: Secondary | ICD-10-CM | POA: Diagnosis not present

## 2019-07-12 DIAGNOSIS — M25662 Stiffness of left knee, not elsewhere classified: Secondary | ICD-10-CM | POA: Diagnosis not present

## 2019-07-12 DIAGNOSIS — M25562 Pain in left knee: Secondary | ICD-10-CM | POA: Diagnosis not present

## 2019-07-14 DIAGNOSIS — Z4789 Encounter for other orthopedic aftercare: Secondary | ICD-10-CM | POA: Diagnosis not present

## 2019-07-17 DIAGNOSIS — M25562 Pain in left knee: Secondary | ICD-10-CM | POA: Diagnosis not present

## 2019-07-17 DIAGNOSIS — M25662 Stiffness of left knee, not elsewhere classified: Secondary | ICD-10-CM | POA: Diagnosis not present

## 2019-07-20 DIAGNOSIS — M25562 Pain in left knee: Secondary | ICD-10-CM | POA: Diagnosis not present

## 2019-07-20 DIAGNOSIS — M25662 Stiffness of left knee, not elsewhere classified: Secondary | ICD-10-CM | POA: Diagnosis not present

## 2019-07-24 DIAGNOSIS — M25662 Stiffness of left knee, not elsewhere classified: Secondary | ICD-10-CM | POA: Diagnosis not present

## 2019-07-24 DIAGNOSIS — M25562 Pain in left knee: Secondary | ICD-10-CM | POA: Diagnosis not present

## 2019-07-26 DIAGNOSIS — M25562 Pain in left knee: Secondary | ICD-10-CM | POA: Diagnosis not present

## 2019-07-26 DIAGNOSIS — M25662 Stiffness of left knee, not elsewhere classified: Secondary | ICD-10-CM | POA: Diagnosis not present

## 2019-07-27 ENCOUNTER — Encounter: Payer: Self-pay | Admitting: Family Medicine

## 2019-08-03 DIAGNOSIS — M25562 Pain in left knee: Secondary | ICD-10-CM | POA: Diagnosis not present

## 2019-08-03 DIAGNOSIS — M25662 Stiffness of left knee, not elsewhere classified: Secondary | ICD-10-CM | POA: Diagnosis not present

## 2019-08-06 DIAGNOSIS — M25662 Stiffness of left knee, not elsewhere classified: Secondary | ICD-10-CM | POA: Diagnosis not present

## 2019-08-06 DIAGNOSIS — M25562 Pain in left knee: Secondary | ICD-10-CM | POA: Diagnosis not present

## 2019-08-09 DIAGNOSIS — M25562 Pain in left knee: Secondary | ICD-10-CM | POA: Diagnosis not present

## 2019-08-09 DIAGNOSIS — M25662 Stiffness of left knee, not elsewhere classified: Secondary | ICD-10-CM | POA: Diagnosis not present

## 2019-08-14 DIAGNOSIS — M25662 Stiffness of left knee, not elsewhere classified: Secondary | ICD-10-CM | POA: Diagnosis not present

## 2019-08-14 DIAGNOSIS — M25562 Pain in left knee: Secondary | ICD-10-CM | POA: Diagnosis not present

## 2019-08-16 DIAGNOSIS — M25562 Pain in left knee: Secondary | ICD-10-CM | POA: Diagnosis not present

## 2019-08-16 DIAGNOSIS — M25662 Stiffness of left knee, not elsewhere classified: Secondary | ICD-10-CM | POA: Diagnosis not present

## 2019-08-17 DIAGNOSIS — S76192A Other specified injury of left quadriceps muscle, fascia and tendon, initial encounter: Secondary | ICD-10-CM | POA: Diagnosis not present

## 2019-08-17 DIAGNOSIS — Z9889 Other specified postprocedural states: Secondary | ICD-10-CM | POA: Diagnosis not present

## 2019-08-27 DIAGNOSIS — R399 Unspecified symptoms and signs involving the genitourinary system: Secondary | ICD-10-CM | POA: Diagnosis not present

## 2019-08-27 DIAGNOSIS — C61 Malignant neoplasm of prostate: Secondary | ICD-10-CM | POA: Diagnosis not present

## 2019-08-27 DIAGNOSIS — R8290 Unspecified abnormal findings in urine: Secondary | ICD-10-CM | POA: Diagnosis not present

## 2019-08-27 DIAGNOSIS — N200 Calculus of kidney: Secondary | ICD-10-CM | POA: Diagnosis not present

## 2019-09-04 DIAGNOSIS — M25662 Stiffness of left knee, not elsewhere classified: Secondary | ICD-10-CM | POA: Diagnosis not present

## 2019-09-04 DIAGNOSIS — M25562 Pain in left knee: Secondary | ICD-10-CM | POA: Diagnosis not present

## 2019-09-06 DIAGNOSIS — M25662 Stiffness of left knee, not elsewhere classified: Secondary | ICD-10-CM | POA: Diagnosis not present

## 2019-09-06 DIAGNOSIS — M25562 Pain in left knee: Secondary | ICD-10-CM | POA: Diagnosis not present

## 2019-09-11 DIAGNOSIS — M25662 Stiffness of left knee, not elsewhere classified: Secondary | ICD-10-CM | POA: Diagnosis not present

## 2019-09-11 DIAGNOSIS — M25562 Pain in left knee: Secondary | ICD-10-CM | POA: Diagnosis not present

## 2019-09-13 DIAGNOSIS — M25662 Stiffness of left knee, not elsewhere classified: Secondary | ICD-10-CM | POA: Diagnosis not present

## 2019-09-13 DIAGNOSIS — M25562 Pain in left knee: Secondary | ICD-10-CM | POA: Diagnosis not present

## 2019-09-18 DIAGNOSIS — M25562 Pain in left knee: Secondary | ICD-10-CM | POA: Diagnosis not present

## 2019-09-18 DIAGNOSIS — M25662 Stiffness of left knee, not elsewhere classified: Secondary | ICD-10-CM | POA: Diagnosis not present

## 2019-09-18 NOTE — Progress Notes (Addendum)
Martha at [facility] [place], [place], [place] [address] [phone]  Date:  09/19/2019   Name:  Jason Obrien   DOB:  [birth year]   MRN:  [id number]  PCP:  Darreld Mclean, MD    Chief Complaint: Pre-Diabetes (6 month follow up)   History of Present Illness:  Jason Obrien is a 68 y.o. very pleasant male patient who presents with the following:  Here today for 59-month follow-up visit History of dyslipidemia, prediabetes, prostate cancer status post prostatectomy 2017 He also suffered a left quad tear earlier this year, which was repaired in August His leg is doing pretty well He is still doing PT to work on his knee flexion- he is still tight, can flex to about 95 degrees now No pain Prior to quad injury he had been walking about 2 miles a day.  He is just recently been able to get back into this routine.  No chest pain or shortness of breath with exercise  He has gained a bit of weight during this injury  Last seen by myself in June for a physical His urologist is Dr. Nevada Crane with [facility]- will see him this week- he will do a PSA  He is retired from his work as a Dietitian, 2 grandchildren who live in [place].  They have not been able to see them lately due to the pandemic  His older son does not have kids and will visit him for the holidays  Labs from June showed slightly low white cell count-however this had resolved on his preoperative labs for quad repair.  Check CBC today for postoperative anemia  Immunizations are up-to-date   BP Readings from Last 3 Encounters:  09/19/19 (!) 145/90  05/09/19 109/85  05/07/19 140/78    Lab Results  Component Value Date   HGBA1C 5.7 (H) 05/07/2019     Patient Active Problem List   Diagnosis Date Noted  . Left quad rutpure 05/08/2019  . Dyslipidemia 06/14/2017  . Prostate cancer ([place]) 07/21/2016  . Pre-diabetes 07/21/2016  . Nephrolithiasis 07/07/2016   . Elevated PSA 05/31/2016  . Overweight 05/20/2016    Past Medical History:  Diagnosis Date  . Arthritis   . Cancer [facility])    Prostate Cancer for SX in Dec.2017  . Chicken pox    childhood  . History of kidney stones 03/2018  . Kidney stones    In 2013 or 2014  . Pre-diabetes     Past Surgical History:  Procedure Laterality Date  . COLONOSCOPY  01/30/2003  . OTHER SURGICAL HISTORY     Right arm surgery for compound fracture   . QUADRICEPS TENDON REPAIR Left 05/08/2019   Procedure: REPAIR QUADRICEP TENDON;  Surgeon: Paralee Cancel, MD;  Location: [facility];  Service: Orthopedics;  Laterality: Left;  90 mins  . robotic assisted lap radical prostatectomy N/A 09/14/2016   prostate cancer- done pe [facility], Dr. Nevada Crane    Social History   Tobacco Use  . Smoking status: Never Smoker  . Smokeless tobacco: Never Used  Substance Use Topics  . Alcohol use: Yes    Alcohol/week: 3.0 standard drinks    Types: 3 Shots of liquor per week    Comment: Social only   . Drug use: No    Family History  Problem Relation Age of Onset  . Hypertension Father   . Heart failure Father  died age 68  . Colon cancer Paternal Grandmother     No Known Allergies  Medication list has been reviewed and updated.  Current Outpatient Medications on File Prior to Visit  Medication Sig Dispense Refill  . lovastatin (MEVACOR) 40 MG tablet Take 1 tablet (40 mg total) by mouth daily. (Patient taking differently: Take 40 mg by mouth daily before supper. ) 90 tablet 3  . metFORMIN (GLUCOPHAGE) 500 MG tablet Take 1 tablet (500 mg total) by mouth daily. (Patient taking differently: Take 500 mg by mouth daily before supper. ) 90 tablet 3  . Multiple Vitamins-Minerals (ONE-A-DAY MENS 50+ ADVANTAGE PO) Take 1 capsule by mouth daily.    . Omega-3 Fatty Acids (FISH OIL ULTRA) 1400 MG CAPS Take 1,400 mg by mouth daily.      No current facility-administered medications on file prior to visit.    Review of  Systems:  As per HPI- otherwise negative.   Physical Examination: Vitals:   09/19/19 0823 09/19/19 0836  BP: (!) 150/82 (!) 145/90  Pulse: 76   Resp: 16   Temp: (!) 95 F (35 C)   SpO2: 98%    Vitals:   09/19/19 0823  Weight: 259 lb (117.5 kg)  Height: 5\' 9"  (1.753 m)   Body mass index is 38.25 kg/m. Ideal Body Weight: Weight in (lb) to have BMI = 25: 168.9  GEN: WDWN, NAD, Non-toxic, A & O x 3, obese, looks well HEENT: Atraumatic, Normocephalic. Neck supple. No masses, No LAD.  TM within normal limits bilaterally Ears and Nose: No external deformity. CV: RRR, No M/G/R. No JVD. No thrill. No extra heart sounds. PULM: CTA B, no wheezes, crackles, rhonchi. No retractions. No resp. distress. No accessory muscle use. EXTR: No c/c/e NEURO Normal gait.  PSYCH: Normally interactive. Conversant. Not depressed or anxious appearing.  Calm demeanor.  Left quad and hamstring show good strength.  His left knee is able to flex just past 90 degrees  Wt Readings from Last 3 Encounters:  09/19/19 259 lb (117.5 kg)  05/08/19 249 lb 1.9 oz (113 kg)  04/29/19 249 lb 1.9 oz (113 kg)    Assessment and Plan: Screening for deficiency anemia - Plan: CBC  Prostate cancer ([place])  Overweight  Pre-diabetes  Dyslipidemia  Elevated BP without diagnosis of hypertension Here today for routine follow-up visit His PSA is followed by urology, he is seeing them tomorrow Blood pressure is high today, likely due to weight gain and lack of exercise with recent quadriceps injury.  He is now getting back into his exercise routine.  I have asked him to purchase a home blood pressure cuff so he can monitor his numbers at home.  If routinely higher than 140/90, he will contact me and I can call in a prescription  Next visit scheduled in July  This visit occurred during the SARS-CoV-2 public health emergency.  Safety protocols were in place, including screening questions prior to the visit, additional  usage of staff PPE, and extensive cleaning of exam room while observing appropriate contact time as indicated for disinfecting solutions.    Signed Lamar Blinks, MD  Received CBC as below, message to patient  Results for orders placed or performed in visit on 09/19/19  CBC  Result Value Ref Range   WBC 4.5 4.0 - 10.5 K/uL   RBC 4.25 4.22 - 5.81 Mil/uL   Platelets 225.0 150.0 - 400.0 K/uL   Hemoglobin 13.8 13.0 - 17.0 g/dL   HCT 41.6 39.0 -  52.0 %   MCV 97.9 78.0 - 100.0 fl   MCHC 33.2 30.0 - 36.0 g/dL   RDW 14.5 11.5 - 15.5 %

## 2019-09-19 ENCOUNTER — Ambulatory Visit (INDEPENDENT_AMBULATORY_CARE_PROVIDER_SITE_OTHER): Payer: Medicare HMO | Admitting: Family Medicine

## 2019-09-19 ENCOUNTER — Encounter: Payer: Self-pay | Admitting: Family Medicine

## 2019-09-19 ENCOUNTER — Other Ambulatory Visit: Payer: Self-pay

## 2019-09-19 VITALS — BP 145/90 | HR 76 | Temp 95.0°F | Resp 16 | Ht 69.0 in | Wt 259.0 lb

## 2019-09-19 DIAGNOSIS — C61 Malignant neoplasm of prostate: Secondary | ICD-10-CM | POA: Diagnosis not present

## 2019-09-19 DIAGNOSIS — Z130 Encounter for screening for diseases of the blood and blood-forming organs and certain disorders involving the immune mechanism: Secondary | ICD-10-CM

## 2019-09-19 DIAGNOSIS — R7303 Prediabetes: Secondary | ICD-10-CM | POA: Diagnosis not present

## 2019-09-19 DIAGNOSIS — E785 Hyperlipidemia, unspecified: Secondary | ICD-10-CM

## 2019-09-19 DIAGNOSIS — E663 Overweight: Secondary | ICD-10-CM

## 2019-09-19 DIAGNOSIS — R030 Elevated blood-pressure reading, without diagnosis of hypertension: Secondary | ICD-10-CM | POA: Diagnosis not present

## 2019-09-19 LAB — CBC
HCT: 41.6 % (ref 39.0–52.0)
Hemoglobin: 13.8 g/dL (ref 13.0–17.0)
MCHC: 33.2 g/dL (ref 30.0–36.0)
MCV: 97.9 fl (ref 78.0–100.0)
Platelets: 225 10*3/uL (ref 150.0–400.0)
RBC: 4.25 Mil/uL (ref 4.22–5.81)
RDW: 14.5 % (ref 11.5–15.5)
WBC: 4.5 10*3/uL (ref 4.0–10.5)

## 2019-09-19 NOTE — Patient Instructions (Addendum)
Good to see you again today- happy holidays!  Your BP and weight are up a bit today- likely due to lack of exercise with your quad tear.  Please purchase a home BP cuff and check it a couple of times a week.  If you continue to run higher than 140/90 please do let me know  Work on getting back into your usual home exercise program and on shedding the pounds you picked up over the next few months

## 2019-09-20 DIAGNOSIS — M25662 Stiffness of left knee, not elsewhere classified: Secondary | ICD-10-CM | POA: Diagnosis not present

## 2019-09-20 DIAGNOSIS — R399 Unspecified symptoms and signs involving the genitourinary system: Secondary | ICD-10-CM | POA: Diagnosis not present

## 2019-09-20 DIAGNOSIS — C61 Malignant neoplasm of prostate: Secondary | ICD-10-CM | POA: Diagnosis not present

## 2019-09-20 DIAGNOSIS — M25562 Pain in left knee: Secondary | ICD-10-CM | POA: Diagnosis not present

## 2019-09-20 DIAGNOSIS — N200 Calculus of kidney: Secondary | ICD-10-CM | POA: Diagnosis not present

## 2019-09-21 DIAGNOSIS — Z9889 Other specified postprocedural states: Secondary | ICD-10-CM | POA: Diagnosis not present

## 2019-09-21 DIAGNOSIS — M25562 Pain in left knee: Secondary | ICD-10-CM | POA: Diagnosis not present

## 2019-09-25 DIAGNOSIS — M25562 Pain in left knee: Secondary | ICD-10-CM | POA: Diagnosis not present

## 2019-09-25 DIAGNOSIS — M25662 Stiffness of left knee, not elsewhere classified: Secondary | ICD-10-CM | POA: Diagnosis not present

## 2019-10-02 DIAGNOSIS — M25662 Stiffness of left knee, not elsewhere classified: Secondary | ICD-10-CM | POA: Diagnosis not present

## 2019-10-02 DIAGNOSIS — M25562 Pain in left knee: Secondary | ICD-10-CM | POA: Diagnosis not present

## 2019-10-04 DIAGNOSIS — M25562 Pain in left knee: Secondary | ICD-10-CM | POA: Diagnosis not present

## 2019-10-04 DIAGNOSIS — M25662 Stiffness of left knee, not elsewhere classified: Secondary | ICD-10-CM | POA: Diagnosis not present

## 2019-10-09 DIAGNOSIS — M25662 Stiffness of left knee, not elsewhere classified: Secondary | ICD-10-CM | POA: Diagnosis not present

## 2019-10-09 DIAGNOSIS — M25562 Pain in left knee: Secondary | ICD-10-CM | POA: Diagnosis not present

## 2019-10-11 DIAGNOSIS — M25562 Pain in left knee: Secondary | ICD-10-CM | POA: Diagnosis not present

## 2019-10-11 DIAGNOSIS — M25662 Stiffness of left knee, not elsewhere classified: Secondary | ICD-10-CM | POA: Diagnosis not present

## 2019-10-16 DIAGNOSIS — M25662 Stiffness of left knee, not elsewhere classified: Secondary | ICD-10-CM | POA: Diagnosis not present

## 2019-10-16 DIAGNOSIS — M25562 Pain in left knee: Secondary | ICD-10-CM | POA: Diagnosis not present

## 2019-10-18 DIAGNOSIS — M25562 Pain in left knee: Secondary | ICD-10-CM | POA: Diagnosis not present

## 2019-10-18 DIAGNOSIS — M25662 Stiffness of left knee, not elsewhere classified: Secondary | ICD-10-CM | POA: Diagnosis not present

## 2019-10-18 DIAGNOSIS — N200 Calculus of kidney: Secondary | ICD-10-CM | POA: Diagnosis not present

## 2019-10-23 DIAGNOSIS — C61 Malignant neoplasm of prostate: Secondary | ICD-10-CM | POA: Diagnosis not present

## 2019-11-02 ENCOUNTER — Ambulatory Visit: Payer: Medicare HMO

## 2019-11-07 ENCOUNTER — Ambulatory Visit: Payer: Medicare HMO

## 2019-11-10 ENCOUNTER — Ambulatory Visit: Payer: Medicare HMO

## 2020-03-31 NOTE — Progress Notes (Signed)
I connected with Jason Obrien today by telephone and verified that I am speaking with the correct person using two identifiers. Location patient: home Location provider: work Persons participating in the virtual visit: patient, Marine scientist.    I discussed the limitations, risks, security and privacy concerns of performing an evaluation and management service by telephone and the availability of in person appointments. I also discussed with the patient that there may be a patient responsible charge related to this service. The patient expressed understanding and verbally consented to this telephonic visit.    Interactive audio and video telecommunications were attempted between this provider and patient, however failed, due to patient having technical difficulties OR patient did not have access to video capability.  We continued and completed visit with audio only.  Some vital signs may be absent or patient reported.    Subjective:   Jason Obrien is a 69 y.o. male who presents for an Initial Medicare Annual Wellness Visit.  Review of Systems     Cardiac Risk Factors include: dyslipidemia;advanced age (>37men, >23 women);male gender     Objective:     Advanced Directives 04/01/2020 05/08/2019 05/07/2019 08/02/2016  Does Patient Have a Medical Advance Directive? No No No No  Does patient want to make changes to medical advance directive? No - Patient declined - - -  Would patient like information on creating a medical advance directive? - No - Patient declined No - Patient declined -    Current Medications (verified) Outpatient Encounter Medications as of 04/01/2020  Medication Sig  . aspirin EC 81 MG tablet Take 81 mg by mouth daily. Swallow whole.  . Glucosamine-Chondroit-Vit C-Mn (GLUCOSAMINE 1500 COMPLEX) CAPS Take by mouth.  . lovastatin (MEVACOR) 40 MG tablet Take 1 tablet (40 mg total) by mouth daily. (Patient taking differently: Take 40 mg by mouth daily before supper. )  . metFORMIN  (GLUCOPHAGE) 500 MG tablet Take 1 tablet (500 mg total) by mouth daily. (Patient taking differently: Take 500 mg by mouth daily before supper. )  . Multiple Vitamins-Minerals (ONE-A-DAY MENS 50+ ADVANTAGE PO) Take 1 capsule by mouth daily.  . Omega-3 Fatty Acids (FISH OIL ULTRA) 1400 MG CAPS Take 1,400 mg by mouth daily.    No facility-administered encounter medications on file as of 04/01/2020.    Allergies (verified) Patient has no known allergies.   History: Past Medical History:  Diagnosis Date  . Arthritis   . Cancer [facility])    Prostate Cancer for SX in Dec.2017  . Chicken pox    childhood  . History of kidney stones 03/2018  . Kidney stones    In 2013 or 2014  . Pre-diabetes    Past Surgical History:  Procedure Laterality Date  . COLONOSCOPY  01/30/2003  . OTHER SURGICAL HISTORY     Right arm surgery for compound fracture   . QUADRICEPS TENDON REPAIR Left 05/08/2019   Procedure: REPAIR QUADRICEP TENDON;  Surgeon: Jason Cancel, MD;  Location: [facility];  Service: Orthopedics;  Laterality: Left;  90 mins  . robotic assisted lap radical prostatectomy N/A 09/14/2016   prostate cancer- done pe [facility], Dr. Nevada Obrien   Family History  Problem Relation Age of Onset  . Hypertension Father   . Heart failure Father        died age 71  . Colon cancer Paternal Grandmother    Social History   Socioeconomic History  . Marital status: Married    Spouse name: Not on file  . Number of children: Not  on file  . Years of education: Not on file  . Highest education level: Not on file  Occupational History  . Not on file  Tobacco Use  . Smoking status: Never Smoker  . Smokeless tobacco: Never Used  Vaping Use  . Vaping Use: Never used  Substance and Sexual Activity  . Alcohol use: Yes    Alcohol/week: 3.0 standard drinks    Types: 3 Shots of liquor per week    Comment: Social only   . Drug use: No  . Sexual activity: Not on file  Other Topics Concern  . Not on file  Social History  Narrative  . Not on file   Social Determinants of Health   Financial Resource Strain: Low Risk   . Difficulty of Paying Living Expenses: Not hard at all  Food Insecurity: No Food Insecurity  . Worried About Charity fundraiser in the Last Year: Never true  . Ran Out of Food in the Last Year: Never true  Transportation Needs: No Transportation Needs  . Lack of Transportation (Medical): No  . Lack of Transportation (Non-Medical): No  Physical Activity:   . Days of Exercise per Week:   . Minutes of Exercise per Session:   Stress:   . Feeling of Stress :   Social Connections:   . Frequency of Communication with Friends and Family:   . Frequency of Social Gatherings with Friends and Family:   . Attends Religious Services:   . Active Member of Clubs or Organizations:   . Attends Archivist Meetings:   Marland Kitchen Marital Status:     Tobacco Counseling Counseling given: Not Answered   Clinical Intake: Pain : No/denies pain     Activities of Daily Living In your present state of health, do you have any difficulty performing the following activities: 04/01/2020 05/08/2019  Hearing? N N  Vision? N N  Difficulty concentrating or making decisions? N N  Walking or climbing stairs? N N  Dressing or bathing? N N  Doing errands, shopping? N N  Preparing Food and eating ? N -  Using the Toilet? N -  In the past six months, have you accidently leaked urine? N -  Do you have problems with loss of bowel control? N -  Managing your Medications? N -  Managing your Finances? N -  Housekeeping or managing your Housekeeping? N -  Some recent data might be hidden    Patient Care Team: Jason, Gay Filler, MD as PCP - General (Family Medicine)  Indicate any recent Medical Services you may have received from other than [facility] providers in the past year (date may be approximate).     Assessment:   This is a routine wellness examination for Jason Obrien.  Hearing/Vision screen Unable to assess.  This visit is enabled though telemedicine due to Covid 19.   Dietary issues and exercise activities discussed: Current Exercise Habits: Home exercise routine, Type of exercise: walking, Time (Minutes): 30, Frequency (Times/Week): 7, Weekly Exercise (Minutes/Week): 210, Intensity: Mild, Exercise limited by: None identified Diet (meal preparation, eat out, water intake, caffeinated beverages, dairy products, fruits and vegetables): in general, a "healthy" diet  , well balanced      Goals    . Maintain healthy active lifestyle.      Depression Screen PHQ 2/9 Scores 04/01/2020 06/13/2017 05/20/2016  PHQ - 2 Score 0 0 0    Fall Risk Fall Risk  04/01/2020 06/13/2017 05/20/2016  Falls in the past year? 1  No No  Number falls in past yr: 0 - -  Injury with Fall? 1 - -  Follow up Education provided;Falls prevention discussed - -   Lives w/ wife in 2 story home. Does well w/ stairs.  Any stairs in or around the home? Yes  If so, are there any without handrails? No  Home free of loose throw rugs in walkways, pet beds, electrical cords, etc? Yes  Adequate lighting in your home to reduce risk of falls? Yes    Cognitive Function:   Ad8 score reviewed for issues:  Issues making decisions:no  Less interest in hobbies / activities:no  Repeats questions, stories (family complaining):no  Trouble using ordinary gadgets (microwave, computer, phone):no  Forgets the month or year: no  Mismanaging finances: no  Remembering appts:no  Daily problems with thinking and/or memory:no Ad8 score is=0       Immunizations Immunization History  Administered Date(s) Administered  . Fluad Quad(high Dose 65+) 07/04/2019  . Influenza, High Dose Seasonal PF 07/21/2016, 06/13/2017, 09/11/2018  . Moderna SARS-COVID-2 Vaccination 11/03/2019, 12/03/2019  . Pneumococcal Conjugate-13 05/20/2016  . Pneumococcal Polysaccharide-23 06/13/2017  . Tdap 10/04/2014  . Zoster Recombinat (Shingrix) 01/19/2019,  04/09/2019    TDAP status: Up to date Flu Vaccine status: Up to date Pneumococcal vaccine status: Up to date Covid-19 vaccine status: Completed vaccines  Qualifies for Shingles Vaccine?   Shingrix Completed?: Yes  Screening Tests Health Maintenance  Topic Date Due  . URINE MICROALBUMIN  Never done  . INFLUENZA VACCINE  05/04/2020  . COLONOSCOPY  08/16/2021  . TETANUS/TDAP  10/04/2024  . COVID-19 Vaccine  Completed  . Hepatitis C Screening  Completed  . PNA vac Low Risk Adult  Completed    Health Maintenance  Health Maintenance Due  Topic Date Due  . URINE MICROALBUMIN  Never done    Colorectal cancer screening: Completed 08/16/16. Repeat every 5 years  Lung Cancer Screening: (Low Dose CT Chest recommended if Age 58-80 years, 30 pack-year currently smoking OR have quit w/in 15years.) does not qualify.    Additional Screening:  Hepatitis C Screening: Completed 05/20/16  Vision Screening: Recommended annual ophthalmology exams for early detection of glaucoma and other disorders of the eye. Is the patient up to date with their annual eye exam?  No  Who is the provider or what is the name of the office in which the patient attends annual eye exams? Dr.Miller.  Dental Screening: Recommended annual dental exams for proper oral hygiene  Community Resource Referral / Chronic Care Management: CRR required this visit?  No   CCM required this visit?  No      Plan:    Please schedule your next medicare wellness visit with me in 1 yr.  Continue to eat heart healthy diet (full of fruits, vegetables, whole grains, lean protein, water--limit salt, fat, and sugar intake) and increase physical activity as tolerated.  Continue doing brain stimulating activities (puzzles, reading, adult coloring books, staying active) to keep memory sharp.    I have personally reviewed and noted the following in the patient's chart:   . Medical and social history . Use of alcohol, tobacco or  illicit drugs  . Current medications and supplements . Functional ability and status . Nutritional status . Physical activity . Advanced directives . List of other physicians . Hospitalizations, surgeries, and ER visits in previous 12 months . Vitals . Screenings to include cognitive, depression, and falls . Referrals and appointments  In addition, I have reviewed and  discussed with patient certain preventive protocols, quality metrics, and best practice recommendations. A written personalized care plan for preventive services as well as general preventive health recommendations were provided to patient.   Due to this being a telephonic visit, the after visit summary with patients personalized plan was offered to patient via mail or my-chart. Patient preferred to pick up at office at next visit.   Naaman Plummer [place], [place]   04/01/2020

## 2020-04-01 ENCOUNTER — Encounter: Payer: Self-pay | Admitting: *Deleted

## 2020-04-01 ENCOUNTER — Ambulatory Visit (INDEPENDENT_AMBULATORY_CARE_PROVIDER_SITE_OTHER): Payer: Medicare HMO | Admitting: *Deleted

## 2020-04-01 ENCOUNTER — Other Ambulatory Visit: Payer: Self-pay

## 2020-04-01 DIAGNOSIS — Z0000 Encounter for general adult medical examination without abnormal findings: Secondary | ICD-10-CM | POA: Diagnosis not present

## 2020-04-01 NOTE — Patient Instructions (Signed)
[name] , Thank you for taking time to come for your Medicare Wellness Visit. I appreciate your ongoing commitment to your health goals. Please review the following plan we discussed and let me know if I can assist you in the future.   Screening recommendations/referrals: Colorectal cancer screening: Completed 08/16/16. Repeat every 5 years Recommended yearly ophthalmology/optometry visit for glaucoma screening and checkup Recommended yearly dental visit for hygiene and checkup  Vaccinations: TDAP status: Up to date Flu Vaccine status: Up to date Pneumococcal vaccine status: Up to date Covid-19 vaccine status: Completed vaccines  Advanced directives: please ask for forms when you come in the office Thursday.  Next appointment: Follow up in one year for your annual wellness visit    Preventive Care 65 Years and Older, Male Preventive care refers to lifestyle choices and visits with your health care provider that can promote health and wellness. This includes:  A yearly physical exam. This is also called an annual well check.  Regular dental and eye exams.  Immunizations.  Screening for certain conditions.  Healthy lifestyle choices, such as diet and exercise. What can I expect for my preventive care visit? Physical exam Your health care provider will check:  Height and weight. These may be used to calculate body mass index (BMI), which is a measurement that tells if you are at a healthy weight.  Heart rate and blood pressure.  Your skin for abnormal spots. Counseling Your health care provider may ask you questions about:  Alcohol, tobacco, and drug use.  Emotional well-being.  Home and relationship well-being.  Sexual activity.  Eating habits.  History of falls.  Memory and ability to understand (cognition).  Work and work Statistician. What immunizations do I need?  Influenza (flu) vaccine  This is recommended every year. Tetanus, diphtheria, and  pertussis (Tdap) vaccine  You may need a Td booster every 10 years. Varicella (chickenpox) vaccine  You may need this vaccine if you have not already been vaccinated. Zoster (shingles) vaccine  You may need this after age 58. Pneumococcal conjugate (PCV13) vaccine  One dose is recommended after age 25. Pneumococcal polysaccharide (PPSV23) vaccine  One dose is recommended after age 89. Measles, mumps, and rubella (MMR) vaccine  You may need at least one dose of MMR if you were born in 1957 or later. You may also need a second dose. Meningococcal conjugate (MenACWY) vaccine  You may need this if you have certain conditions. Hepatitis A vaccine  You may need this if you have certain conditions or if you travel or work in places where you may be exposed to hepatitis A. Hepatitis B vaccine  You may need this if you have certain conditions or if you travel or work in places where you may be exposed to hepatitis B. Haemophilus influenzae type b (Hib) vaccine  You may need this if you have certain conditions. You may receive vaccines as individual doses or as more than one vaccine together in one shot (combination vaccines). Talk with your health care provider about the risks and benefits of combination vaccines. What tests do I need? Blood tests  Lipid and cholesterol levels. These may be checked every 5 years, or more frequently depending on your overall health.  Hepatitis C test.  Hepatitis B test. Screening  Lung cancer screening. You may have this screening every year starting at age 10 if you have a 30-pack-year history of smoking and currently smoke or have quit within the past 15 years.  Colorectal cancer  screening. All adults should have this screening starting at age 75 and continuing until age 57. Your health care provider may recommend screening at age 53 if you are at increased risk. You will have tests every 1-10 years, depending on your results and the type of  screening test.  Prostate cancer screening. Recommendations will vary depending on your family history and other risks.  Diabetes screening. This is done by checking your blood sugar (glucose) after you have not eaten for a while (fasting). You may have this done every 1-3 years.  Abdominal aortic aneurysm (AAA) screening. You may need this if you are a current or former smoker.  Sexually transmitted disease (STD) testing. Follow these instructions at home: Eating and drinking  Eat a diet that includes fresh fruits and vegetables, whole grains, lean protein, and low-fat dairy products. Limit your intake of foods with high amounts of sugar, saturated fats, and salt.  Take vitamin and mineral supplements as recommended by your health care provider.  Do not drink alcohol if your health care provider tells you not to drink.  If you drink alcohol: ? Limit how much you have to 0-2 drinks a day. ? Be aware of how much alcohol is in your drink. In the [place] one drink equals one 12 oz bottle of beer (355 mL), one 5 oz glass of wine (148 mL), or one 1 oz glass of hard liquor (44 mL). Lifestyle  Take daily care of your teeth and gums.  Stay active. Exercise for at least 30 minutes on 5 or more days each week.  Do not use any products that contain nicotine or tobacco, such as cigarettes, e-cigarettes, and chewing tobacco. If you need help quitting, ask your health care provider.  If you are sexually active, practice safe sex. Use a condom or other form of protection to prevent STIs (sexually transmitted infections).  Talk with your health care provider about taking a low-dose aspirin or statin. What's next?  Visit your health care provider once a year for a well check visit.  Ask your health care provider how often you should have your eyes and teeth checked.  Stay up to date on all vaccines. This information is not intended to replace advice given to you by your health care provider.  Make sure you discuss any questions you have with your health care provider. Document Revised: 09/14/2018 Document Reviewed: 09/14/2018 Elsevier Patient Education  2020 [employer].

## 2020-04-02 NOTE — Patient Instructions (Signed)
It was great to see you again today, I will be in touch with your labs as soon as possible Have safe travels later this summer Please work on gradual weight loss through diet and exercise- about 1 pound per week is a good goal   Health Maintenance After Age 70 After age 61, you are at a higher risk for certain long-term diseases and infections as well as injuries from falls. Falls are a major cause of broken bones and head injuries in people who are older than age 16. Getting regular preventive care can help to keep you healthy and well. Preventive care includes getting regular testing and making lifestyle changes as recommended by your health care provider. Talk with your health care provider about:  Which screenings and tests you should have. A screening is a test that checks for a disease when you have no symptoms.  A diet and exercise plan that is right for you. What should I know about screenings and tests to prevent falls? Screening and testing are the best ways to find a health problem early. Early diagnosis and treatment give you the best chance of managing medical conditions that are common after age 21. Certain conditions and lifestyle choices may make you more likely to have a fall. Your health care provider may recommend:  Regular vision checks. Poor vision and conditions such as cataracts can make you more likely to have a fall. If you wear glasses, make sure to get your prescription updated if your vision changes.  Medicine review. Work with your health care provider to regularly review all of the medicines you are taking, including over-the-counter medicines. Ask your health care provider about any side effects that may make you more likely to have a fall. Tell your health care provider if any medicines that you take make you feel dizzy or sleepy.  Osteoporosis screening. Osteoporosis is a condition that causes the bones to get weaker. This can make the bones weak and cause them to  break more easily.  Blood pressure screening. Blood pressure changes and medicines to control blood pressure can make you feel dizzy.  Strength and balance checks. Your health care provider may recommend certain tests to check your strength and balance while standing, walking, or changing positions.  Foot health exam. Foot pain and numbness, as well as not wearing proper footwear, can make you more likely to have a fall.  Depression screening. You may be more likely to have a fall if you have a fear of falling, feel emotionally low, or feel unable to do activities that you used to do.  Alcohol use screening. Using too much alcohol can affect your balance and may make you more likely to have a fall. What actions can I take to lower my risk of falls? General instructions  Talk with your health care provider about your risks for falling. Tell your health care provider if: ? You fall. Be sure to tell your health care provider about all falls, even ones that seem minor. ? You feel dizzy, sleepy, or off-balance.  Take over-the-counter and prescription medicines only as told by your health care provider. These include any supplements.  Eat a healthy diet and maintain a healthy weight. A healthy diet includes low-fat dairy products, low-fat (lean) meats, and fiber from whole grains, beans, and lots of fruits and vegetables. Home safety  Remove any tripping hazards, such as rugs, cords, and clutter.  Install safety equipment such as grab bars in bathrooms and safety  rails on stairs.  Keep rooms and walkways well-lit. Activity   Follow a regular exercise program to stay fit. This will help you maintain your balance. Ask your health care provider what types of exercise are appropriate for you.  If you need a cane or walker, use it as recommended by your health care provider.  Wear supportive shoes that have nonskid soles. Lifestyle  Do not drink alcohol if your health care provider tells  you not to drink.  If you drink alcohol, limit how much you have: ? 0-1 drink a day for women. ? 0-2 drinks a day for men.  Be aware of how much alcohol is in your drink. In the [place] one drink equals one typical bottle of beer (12 oz), one-half glass of wine (5 oz), or one shot of hard liquor (1 oz).  Do not use any products that contain nicotine or tobacco, such as cigarettes and e-cigarettes. If you need help quitting, ask your health care provider. Summary  Having a healthy lifestyle and getting preventive care can help to protect your health and wellness after age 4.  Screening and testing are the best way to find a health problem early and help you avoid having a fall. Early diagnosis and treatment give you the best chance for managing medical conditions that are more common for people who are older than age 11.  Falls are a major cause of broken bones and head injuries in people who are older than age 30. Take precautions to prevent a fall at home.  Work with your health care provider to learn what changes you can make to improve your health and wellness and to prevent falls. This information is not intended to replace advice given to you by your health care provider. Make sure you discuss any questions you have with your health care provider. Document Revised: 01/11/2019 Document Reviewed: 08/03/2017 Elsevier Patient Education  2020 [employer].

## 2020-04-02 NOTE — Progress Notes (Addendum)
[facility] [address], [place], [place] [address] [phone]  Date:  04/03/2020   Name:  Jason Obrien   DOB:  [birth year]   MRN:  [id number]  PCP:  Darreld Mclean, MD    Chief Complaint: Annual Exam   History of Present Illness:  Jason Obrien is a 69 y.o. very pleasant male patient who presents with the following:  [name] is seen today for a physical exam Last visit with myself December 2020  History of dyslipidemia, prediabetes, prostate cancer status post prostatectomy 2017, left quad tear 2020 which was repaired in August 2020 This has healed well, he completed his rehab.   He enjoys working in his yard. He did recently get some PI on his arms and legs, he is using topical steroids and is doing ok. Does not want pred at this time  His urologist is Dr. Nevada Crane at [facility]- appt in a month or so.  His PSA has been zero  He is retired from his work as a Dietitian Married to [place], they have 2 children and 2 grandchildren live in [place]  They are 3 and 70 yo  Colon cancer screening due next year Covid series is complete Shingrix complete Can update labs today- he is fasting today  Never smoker Moderate alcohol Exercise; he is doing a regular routine.  He walks 2.5 miles each day, and does some work in the yard also. He has gained some weight but plans to work on this- would like to get down to about 230 if he can   Aspirin 81 Lovastatin Metformin Omega-3  Wt Readings from Last 3 Encounters:  04/03/20 261 lb (118.4 kg)  09/19/19 259 lb (117.5 kg)  05/08/19 249 lb 1.9 oz (113 kg)    Patient Active Problem List   Diagnosis Date Noted   Left quad rutpure 05/08/2019   Dyslipidemia 06/14/2017   Prostate cancer ([place]) 07/21/2016   Pre-diabetes 07/21/2016   Nephrolithiasis 07/07/2016   Elevated PSA 05/31/2016   Overweight 05/20/2016    Past Medical History:  Diagnosis Date    Arthritis    Cancer [facility])    Prostate Cancer for SX in Dec.2017   Chicken pox    childhood   History of kidney stones 03/2018   Kidney stones    In 2013 or 2014   Pre-diabetes     Past Surgical History:  Procedure Laterality Date   COLONOSCOPY  01/30/2003   OTHER SURGICAL HISTORY     Right arm surgery for compound fracture    QUADRICEPS TENDON REPAIR Left 05/08/2019   Procedure: REPAIR QUADRICEP TENDON;  Surgeon: Paralee Cancel, MD;  Location: [facility];  Service: Orthopedics;  Laterality: Left;  90 mins   robotic assisted lap radical prostatectomy N/A 09/14/2016   prostate cancer- done pe [facility], Dr. Nevada Crane    Social History   Tobacco Use   Smoking status: Never Smoker   Smokeless tobacco: Never Used  Vaping Use   Vaping Use: Never used  Substance Use Topics   Alcohol use: Yes    Alcohol/week: 3.0 standard drinks    Types: 3 Shots of liquor per week    Comment: Social only    Drug use: No    Family History  Problem Relation Age of Onset   Hypertension Father    Heart failure Father        died age 29  Colon cancer Paternal Grandmother     No Known Allergies  Medication list has been reviewed and updated.  Current Outpatient Medications on File Prior to Visit  Medication Sig Dispense Refill   aspirin EC 81 MG tablet Take 81 mg by mouth daily. Swallow whole.     Glucosamine-Chondroit-Vit C-Mn (GLUCOSAMINE 1500 COMPLEX) CAPS Take by mouth.     Multiple Vitamins-Minerals (ONE-A-DAY MENS 50+ ADVANTAGE PO) Take 1 capsule by mouth daily.     Omega-3 Fatty Acids (FISH OIL ULTRA) 1400 MG CAPS Take 1,400 mg by mouth daily.      No current facility-administered medications on file prior to visit.    Review of Systems:  As per HPI- otherwise negative.   Physical Examination: Vitals:   04/03/20 0835  BP: 126/80  Pulse: 75  Resp: 16  SpO2: 96%   Vitals:   04/03/20 0835  Weight: 261 lb (118.4 kg)  Height: 5\' 9"  (1.753 m)   Body mass index  is 38.54 kg/m. Ideal Body Weight: Weight in (lb) to have BMI = 25: 168.9  GEN: no acute distress.  Looks well, obese HEENT: Atraumatic, Normocephalic.  Bilateral TM wnl, oropharynx normal.  PEERL,EOMI.   Ears and Nose: No external deformity. CV: RRR, No M/G/R. No JVD. No thrill. No extra heart sounds. PULM: CTA B, no wheezes, crackles, rhonchi. No retractions. No resp. distress. No accessory muscle use. ABD: S, NT, ND, +BS. No rebound. No HSM. EXTR: No c/c/e PSYCH: Normally interactive. Conversant.  Scar above left knee from quad tear repair Mild PI rash on arms and legs    Assessment and Plan: Physical exam  Pre-diabetes - Plan: Comprehensive metabolic panel, Hemoglobin A1c, metFORMIN (GLUCOPHAGE) 500 MG tablet  Dyslipidemia - Plan: Lipid panel, lovastatin (MEVACOR) 40 MG tablet  Prostate cancer ([facility])  Screening for deficiency anemia - Plan: CBC  Class 2 obesity due to excess calories without serious comorbidity with body mass index (BMI) of 38.0 to 38.9 in adult  CPE today Refilled medications, labs pending as above Discussed colon cancer screening- I encouraged him to continue screening for now as he is in relatively good health  Encouraged gradual weight loss He continues to see urology at recommended intervals  This visit occurred during the SARS-CoV-2 public health emergency.  Safety protocols were in place, including screening questions prior to the visit, additional usage of staff PPE, and extensive cleaning of exam room while observing appropriate contact time as indicated for disinfecting solutions.    Signed Lamar Blinks, MD  Received his labs as below, message to patient A1c remains in prediabetes range, slightly higher than previous Lipids are stable Results for orders placed or performed in visit on 04/03/20  CBC  Result Value Ref Range   WBC 4.1 4.0 - 10.5 K/uL   RBC 4.09 (L) 4.22 - 5.81 Mil/uL   Platelets 233.0 150 - 400 K/uL   Hemoglobin 13.8 13.0  - 17.0 g/dL   HCT 40.9 39 - 52 %   MCV 100.1 (H) 78.0 - 100.0 fl   MCHC 33.6 30.0 - 36.0 g/dL   RDW 14.2 11.5 - 15.5 %  Comprehensive metabolic panel  Result Value Ref Range   Sodium 143 135 - 145 mEq/L   Potassium 4.3 3.5 - 5.1 mEq/L   Chloride 108 96 - 112 mEq/L   CO2 28 19 - 32 mEq/L   Glucose, Bld 131 (H) 70 - 99 mg/dL   BUN 23 6 - 23 mg/dL   Creatinine, Ser 0.90  0.40 - 1.50 mg/dL   Total Bilirubin 0.8 0.2 - 1.2 mg/dL   Alkaline Phosphatase 58 39 - 117 U/L   AST 18 0 - 37 U/L   ALT 19 0 - 53 U/L   Total Protein 6.6 6.0 - 8.3 g/dL   Albumin 4.2 3.5 - 5.2 g/dL   GFR 83.33 >60.00 mL/min   Calcium 8.9 8.4 - 10.5 mg/dL  Hemoglobin A1c  Result Value Ref Range   Hgb A1c MFr Bld 6.2 4.6 - 6.5 %  Lipid panel  Result Value Ref Range   Cholesterol 138 0 - 200 mg/dL   Triglycerides 53.0 0 - 149 mg/dL   HDL 56.00 >39.00 mg/dL   VLDL 10.6 0.0 - 40.0 mg/dL   LDL Cholesterol 72 0 - 99 mg/dL   Total CHOL/HDL Ratio 2    NonHDL 82.34

## 2020-04-03 ENCOUNTER — Encounter: Payer: Self-pay | Admitting: Family Medicine

## 2020-04-03 ENCOUNTER — Ambulatory Visit (INDEPENDENT_AMBULATORY_CARE_PROVIDER_SITE_OTHER): Payer: Medicare HMO | Admitting: Family Medicine

## 2020-04-03 ENCOUNTER — Other Ambulatory Visit: Payer: Self-pay

## 2020-04-03 VITALS — BP 126/80 | HR 75 | Resp 16 | Ht 69.0 in | Wt 261.0 lb

## 2020-04-03 DIAGNOSIS — Z0000 Encounter for general adult medical examination without abnormal findings: Secondary | ICD-10-CM | POA: Diagnosis not present

## 2020-04-03 DIAGNOSIS — C61 Malignant neoplasm of prostate: Secondary | ICD-10-CM | POA: Diagnosis not present

## 2020-04-03 DIAGNOSIS — E6609 Other obesity due to excess calories: Secondary | ICD-10-CM

## 2020-04-03 DIAGNOSIS — Z6838 Body mass index (BMI) 38.0-38.9, adult: Secondary | ICD-10-CM

## 2020-04-03 DIAGNOSIS — R7303 Prediabetes: Secondary | ICD-10-CM | POA: Diagnosis not present

## 2020-04-03 DIAGNOSIS — E785 Hyperlipidemia, unspecified: Secondary | ICD-10-CM | POA: Diagnosis not present

## 2020-04-03 DIAGNOSIS — Z130 Encounter for screening for diseases of the blood and blood-forming organs and certain disorders involving the immune mechanism: Secondary | ICD-10-CM | POA: Diagnosis not present

## 2020-04-03 LAB — LIPID PANEL
Cholesterol: 138 mg/dL (ref 0–200)
HDL: 56 mg/dL (ref >39.00)
LDL Cholesterol: 72 mg/dL (ref 0–99)
NonHDL: 82.34
Total CHOL/HDL Ratio: 2
Triglycerides: 53 mg/dL (ref 0.0–149.0)
VLDL: 10.6 mg/dL (ref 0.0–40.0)

## 2020-04-03 LAB — COMPREHENSIVE METABOLIC PANEL
ALT: 19 U/L (ref 0–53)
AST: 18 U/L (ref 0–37)
Albumin: 4.2 g/dL (ref 3.5–5.2)
Alkaline Phosphatase: 58 U/L (ref 39–117)
BUN: 23 mg/dL (ref 6–23)
CO2: 28 mEq/L (ref 19–32)
Calcium: 8.9 mg/dL (ref 8.4–10.5)
Chloride: 108 mEq/L (ref 96–112)
Creatinine, Ser: 0.9 mg/dL (ref 0.40–1.50)
GFR: 83.33 mL/min (ref >60.00)
Glucose, Bld: 131 mg/dL — ABNORMAL HIGH (ref 70–99)
Potassium: 4.3 mEq/L (ref 3.5–5.1)
Sodium: 143 mEq/L (ref 135–145)
Total Bilirubin: 0.8 mg/dL (ref 0.2–1.2)
Total Protein: 6.6 g/dL (ref 6.0–8.3)

## 2020-04-03 LAB — CBC
HCT: 40.9 % (ref 39.0–52.0)
Hemoglobin: 13.8 g/dL (ref 13.0–17.0)
MCHC: 33.6 g/dL (ref 30.0–36.0)
MCV: 100.1 fl — ABNORMAL HIGH (ref 78.0–100.0)
Platelets: 233 10*3/uL (ref 150.0–400.0)
RBC: 4.09 Mil/uL — ABNORMAL LOW (ref 4.22–5.81)
RDW: 14.2 % (ref 11.5–15.5)
WBC: 4.1 10*3/uL (ref 4.0–10.5)

## 2020-04-03 LAB — HEMOGLOBIN A1C: Hgb A1c MFr Bld: 6.2 % (ref 4.6–6.5)

## 2020-04-03 MED ORDER — METFORMIN HCL 500 MG PO TABS: 500.0000 mg | ORAL_TABLET | Freq: Every day | ORAL | 3 refills | Status: DC

## 2020-04-03 MED ORDER — LOVASTATIN 40 MG PO TABS: 40.0000 mg | ORAL_TABLET | Freq: Every day | ORAL | 3 refills | Status: DC

## 2020-05-02 ENCOUNTER — Ambulatory Visit (INDEPENDENT_AMBULATORY_CARE_PROVIDER_SITE_OTHER): Payer: Medicare HMO | Admitting: Family Medicine

## 2020-05-02 ENCOUNTER — Encounter: Payer: Self-pay | Admitting: Family Medicine

## 2020-05-02 ENCOUNTER — Other Ambulatory Visit: Payer: Self-pay

## 2020-05-02 VITALS — BP 138/86 | HR 71 | Temp 98.8°F | Ht 69.0 in | Wt 261.4 lb

## 2020-05-02 DIAGNOSIS — R21 Rash and other nonspecific skin eruption: Secondary | ICD-10-CM

## 2020-05-02 MED ORDER — TRIAMCINOLONE ACETONIDE 0.1 % EX CREA: 1.0000 "application " | TOPICAL_CREAM | Freq: Two times a day (BID) | CUTANEOUS | 0 refills | Status: DC

## 2020-05-02 NOTE — Patient Instructions (Addendum)
Use unscented lotions at least twice daily.  Triple antibiotic ointment or Neosporin twice daily over the area where the poison ivy affected.  Let me know if things aren't turning the corner in the next 10-14 days.   Things to look out for: increasing pain not relieved by ibuprofen/acetaminophen, fevers, spreading redness, drainage of pus, or foul odor.  Let us know if you need anything.

## 2020-05-02 NOTE — Progress Notes (Signed)
Chief Complaint  Patient presents with  . Rash    Jason Obrien is a 69 y.o. male here for a skin complaint.  Duration: 10 days Location: LLE Pruritic? Yes, slight Painful? No, feels like a sunburn Drainage? No New soaps/lotions/topicals/detergents? No Sick contacts? No Other associated symptoms: poison ivy preceded it; no fevers or spreading Therapies tried thus far: nothing  Past Medical History:  Diagnosis Date  . Arthritis   . Cancer [facility])    Prostate Cancer for SX in Dec.2017  . Chicken pox    childhood  . History of kidney stones 03/2018  . Kidney stones    In 2013 or 2014  . Pre-diabetes     BP (!) 138/86 (BP Location: Right Arm, Patient Position: Sitting, Cuff Size: Large)   Pulse 71   Temp 98.8 F (37.1 C) (Oral)   Ht 5\' 9"  (1.753 m)   Wt (!) 261 lb 6 oz (118.6 kg)   SpO2 95%   BMI 38.60 kg/m  Gen: awake, alert, appearing stated age Lungs: No accessory muscle use Skin: +hyperemia with scaling; there is excoriation (where his poison ivy was); this is at the medial and anterior left distal leg. No drainage, TTP, fluctuance, warmth Psych: Age appropriate judgment and insight  Rash - Plan: triamcinolone cream (KENALOG) 0.1 %  Orders as above. TAO over excoriation from poison ivy. This does not sound or feel like cellulitis. Will tx for dermatitis. At least BID emollients.  F/u prn. The patient voiced understanding and agreement to the plan.  [place], DO 05/02/20 9:21 AM

## 2020-05-07 DIAGNOSIS — Z8546 Personal history of malignant neoplasm of prostate: Secondary | ICD-10-CM | POA: Diagnosis not present

## 2020-05-07 DIAGNOSIS — N1330 Unspecified hydronephrosis: Secondary | ICD-10-CM | POA: Diagnosis not present

## 2020-05-08 DIAGNOSIS — N1330 Unspecified hydronephrosis: Secondary | ICD-10-CM | POA: Diagnosis not present

## 2020-05-08 DIAGNOSIS — N200 Calculus of kidney: Secondary | ICD-10-CM | POA: Diagnosis not present

## 2020-05-08 DIAGNOSIS — Z87442 Personal history of urinary calculi: Secondary | ICD-10-CM | POA: Diagnosis not present

## 2020-05-08 DIAGNOSIS — N135 Crossing vessel and stricture of ureter without hydronephrosis: Secondary | ICD-10-CM | POA: Diagnosis not present

## 2020-05-14 DIAGNOSIS — C61 Malignant neoplasm of prostate: Secondary | ICD-10-CM | POA: Diagnosis not present

## 2020-05-14 DIAGNOSIS — N135 Crossing vessel and stricture of ureter without hydronephrosis: Secondary | ICD-10-CM | POA: Diagnosis not present

## 2020-05-14 DIAGNOSIS — N200 Calculus of kidney: Secondary | ICD-10-CM | POA: Diagnosis not present

## 2020-05-19 ENCOUNTER — Encounter: Payer: Self-pay | Admitting: Internal Medicine

## 2020-05-19 ENCOUNTER — Ambulatory Visit (HOSPITAL_BASED_OUTPATIENT_CLINIC_OR_DEPARTMENT_OTHER)
Admission: RE | Admit: 2020-05-19 | Discharge: 2020-05-19 | Disposition: A | Discharge status: Home / Self Care | Payer: Medicare HMO | Source: Ambulatory Visit | Attending: Internal Medicine | Admitting: Internal Medicine

## 2020-05-19 ENCOUNTER — Other Ambulatory Visit: Payer: Self-pay

## 2020-05-19 ENCOUNTER — Ambulatory Visit (INDEPENDENT_AMBULATORY_CARE_PROVIDER_SITE_OTHER): Payer: Medicare HMO | Admitting: Internal Medicine

## 2020-05-19 VITALS — BP 152/78 | HR 70 | Temp 98.2°F | Resp 16 | Ht 69.0 in | Wt 260.4 lb

## 2020-05-19 DIAGNOSIS — M79605 Pain in left leg: Secondary | ICD-10-CM

## 2020-05-19 DIAGNOSIS — L03116 Cellulitis of left lower limb: Secondary | ICD-10-CM | POA: Diagnosis not present

## 2020-05-19 DIAGNOSIS — M779 Enthesopathy, unspecified: Secondary | ICD-10-CM

## 2020-05-19 DIAGNOSIS — R030 Elevated blood-pressure reading, without diagnosis of hypertension: Secondary | ICD-10-CM

## 2020-05-19 MED ORDER — CEPHALEXIN 500 MG PO CAPS: 500.0000 mg | ORAL_CAPSULE | Freq: Four times a day (QID) | ORAL | 0 refills | Status: DC

## 2020-05-19 NOTE — Patient Instructions (Signed)
Please go to the first floor and arrange for the ultrasound of your left leg  Start antibiotic called Keflex  Gentle stretching of both Achilles tendon, eyes, take it easy for the next week.  Tylenol  500 mg OTC 2 tabs a day every 8 hours as needed for pain  Okay to take occasionally a ibuprofen.  Always take it with food because may cause gastritis and ulcers.  If you notice nausea, stomach pain, change in the color of stools --->  Stop the medicine and let us know  Leg elevation  Call if not back to normal in 10 days

## 2020-05-19 NOTE — Progress Notes (Signed)
Pre visit review using our clinic review tool, if applicable. No additional management support is needed unless otherwise documented below in the visit note. 

## 2020-05-19 NOTE — Progress Notes (Signed)
Subjective:    Patient ID: Jason Obrien, male    DOB: [birth year], 69 y.o.   MRN: [id number]  DOS:  05/19/2020 Type of visit - description: Acute 3 days ago developed pain at the Achilles tendon bilaterally. The pain is still there today but is not as severe. There was no injury or overuse (he walks 2.5 miles 5 times a week, nothing new to him).  Also, developed rash on the left ankle about 4 weeks ago, was seen at this office by another provider and prescribed topical steroids. The area is perhaps slightly better but the rash is not gone.  Denies any calf pain or claudication per se. Has varicose veins, they have no change.  Not TTP.  BP Readings from Last 3 Encounters:  05/19/20 (!) 176/97  05/02/20 (!) 138/86  04/03/20 126/80     Review of Systems See above   Past Medical History:  Diagnosis Date  . Arthritis   . Cancer [facility])    Prostate Cancer for SX in Dec.2017  . Chicken pox    childhood  . History of kidney stones 03/2018  . Kidney stones    In 2013 or 2014  . Pre-diabetes     Past Surgical History:  Procedure Laterality Date  . COLONOSCOPY  01/30/2003  . OTHER SURGICAL HISTORY     Right arm surgery for compound fracture   . QUADRICEPS TENDON REPAIR Left 05/08/2019   Procedure: REPAIR QUADRICEP TENDON;  Surgeon: Paralee Cancel, MD;  Location: [facility];  Service: Orthopedics;  Laterality: Left;  90 mins  . robotic assisted lap radical prostatectomy N/A 09/14/2016   prostate cancer- done pe [facility], Dr. Nevada Crane    Allergies as of 05/19/2020   No Known Allergies     Medication List       Accurate as of May 19, 2020  3:33 PM. If you have any questions, ask your nurse or doctor.        aspirin EC 81 MG tablet Take 81 mg by mouth daily. Swallow whole.   Fish Oil Ultra 1400 MG Caps Take 1,400 mg by mouth daily.   Glucosamine 1500 Complex Caps Take by mouth.   lovastatin 40 MG tablet Commonly known as: MEVACOR Take 1 tablet (40 mg total) by mouth  daily.   metFORMIN 500 MG tablet Commonly known as: GLUCOPHAGE Take 1 tablet (500 mg total) by mouth daily.   ONE-A-DAY MENS 50+ ADVANTAGE PO Take 1 capsule by mouth daily.   triamcinolone cream 0.1 % Commonly known as: KENALOG Apply 1 application topically 2 (two) times daily.          Objective:   Physical Exam BP (!) 176/97 (BP Location: Left Arm, Patient Position: Sitting, Cuff Size: Normal)   Pulse 70   Temp 98.2 F (36.8 C) (Oral)   Resp 16   Ht 5\' 9"  (1.753 m)   Wt 260 lb 6 oz (118.1 kg)   SpO2 97%   BMI 38.45 kg/m  General:   Well developed, NAD, BMI noted. HEENT:  Normocephalic . Face symmetric, atraumatic Lower extremities:  Mild redness at the left pretibial area, see picture.  Minimal warmness if any.  No TTP. Calves are symmetric and not tender. The distal pretibial area on the left (just above the ankle) is swollen compared to the right. Good pedal pulses bilaterally Palpation of the Achilles tendon bilaterally: No rupture that I can tell, no TTP. Skin: Not pale. Not jaundice Neurologic:  alert & oriented X3.  Speech normal, gait appropriate for age and unassisted Lower extremity DTRs: Absent right ankle jerk otherwise symmetric. Psych--  Cognition and judgment appear intact.  Cooperative with normal attention span and concentration.  Behavior appropriate. No anxious or depressed appearing.        Assessment      69 year old patient, history of prostate cancer, urolithiasis, high cholesterol, left quad rupture, presents with:  Tendinitis: The patient developed pain at the Achilles tendons bilaterally, the pain is a slightly better.  Recommend to continue gentle stretching, Tylenol and occasional ibuprofen with GI precautions. Cellulitis?  The patient has a 4-week history of mild redness at the left pretibial area, is not getting better with topical steroids. There is some swelling. He did have a trip to [place] 4 weeks ago. Plan: Keflex  (cellulitis?),  Ultrasound rule out DVT. Elevated BP: Initially 176/97, recheck: 152/78.  Observation  This visit occurred during the SARS-CoV-2 public health emergency.  Safety protocols were in place, including screening questions prior to the visit, additional usage of staff PPE, and extensive cleaning of exam room while observing appropriate contact time as indicated for disinfecting solutions.

## 2020-09-16 DIAGNOSIS — R69 Illness, unspecified: Secondary | ICD-10-CM | POA: Diagnosis not present

## 2020-10-06 ENCOUNTER — Ambulatory Visit: Payer: Medicare HMO | Admitting: Family Medicine

## 2020-10-08 DIAGNOSIS — I10 Essential (primary) hypertension: Secondary | ICD-10-CM | POA: Diagnosis not present

## 2020-10-08 DIAGNOSIS — H5203 Hypermetropia, bilateral: Secondary | ICD-10-CM | POA: Diagnosis not present

## 2020-10-08 DIAGNOSIS — H35033 Hypertensive retinopathy, bilateral: Secondary | ICD-10-CM | POA: Diagnosis not present

## 2020-11-05 IMAGING — DX LEFT KNEE - COMPLETE 4+ VIEW
4 series · 4 of 4 positions shown · non-contrast
Comparison: None.

CLINICAL DATA: Fall

EXAM:
LEFT KNEE - COMPLETE 4+ VIEW

[knee ap]
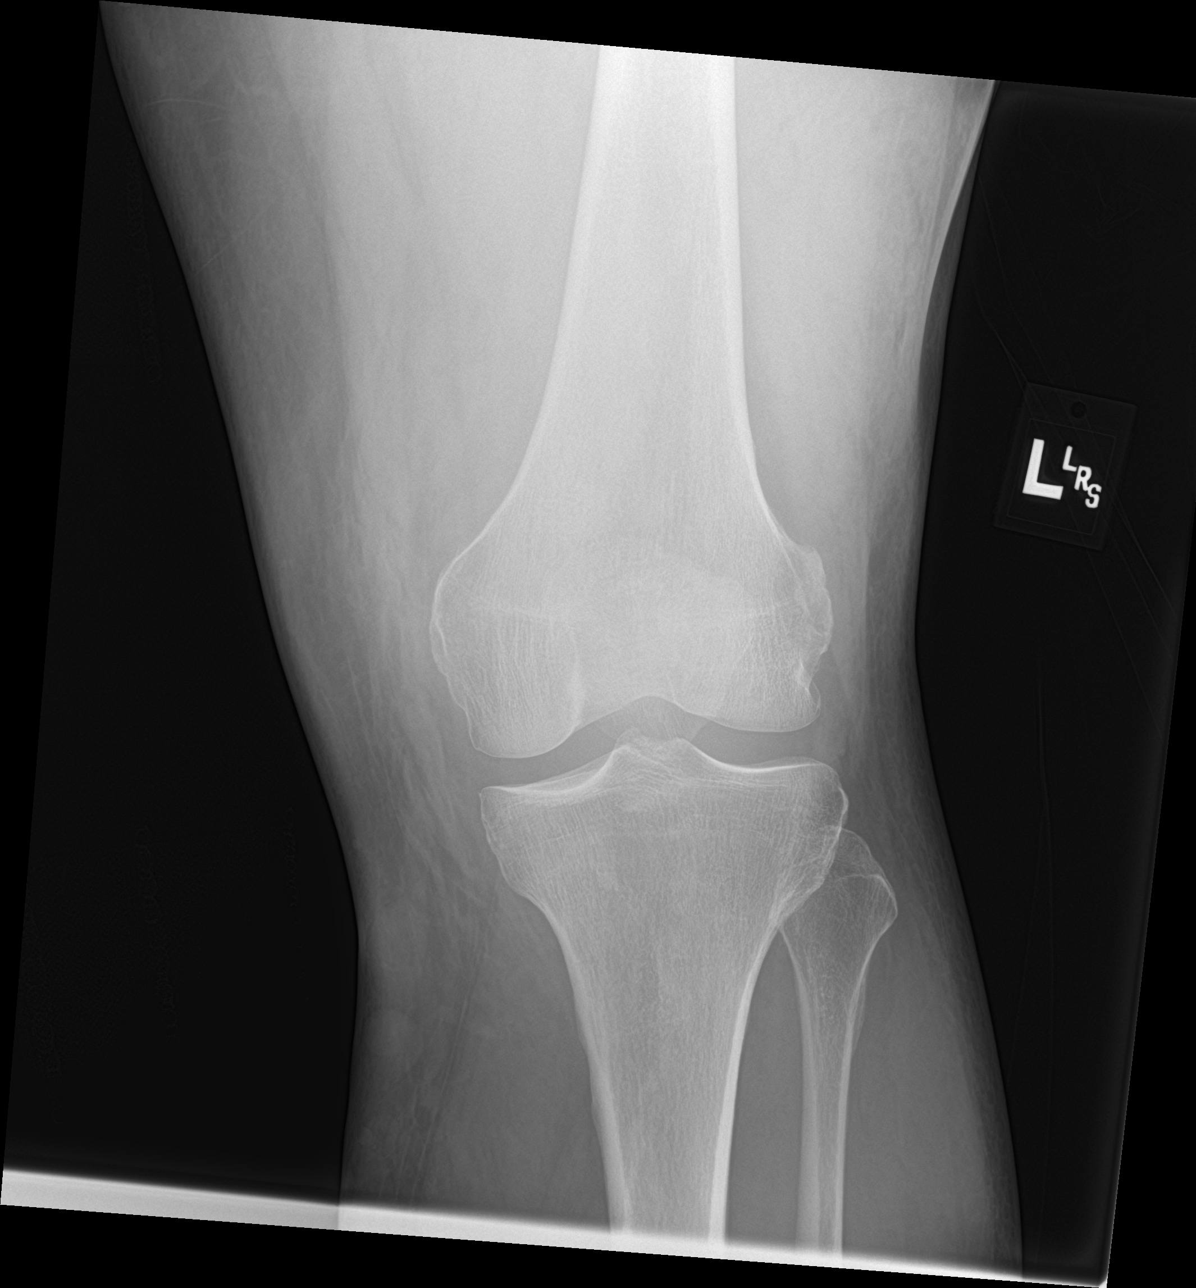

[knee lat]
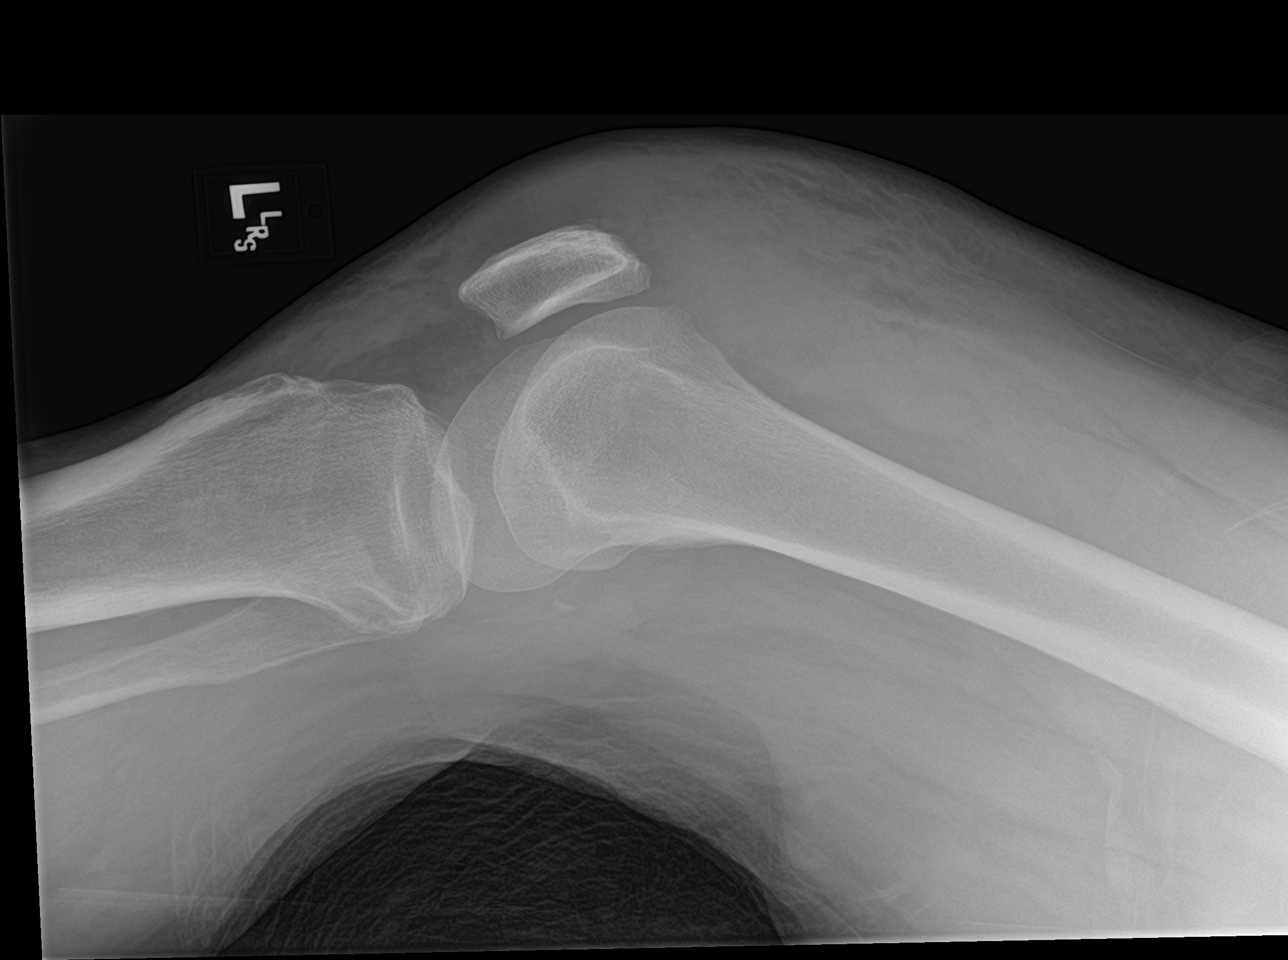

[knee obl (1 of 2)]
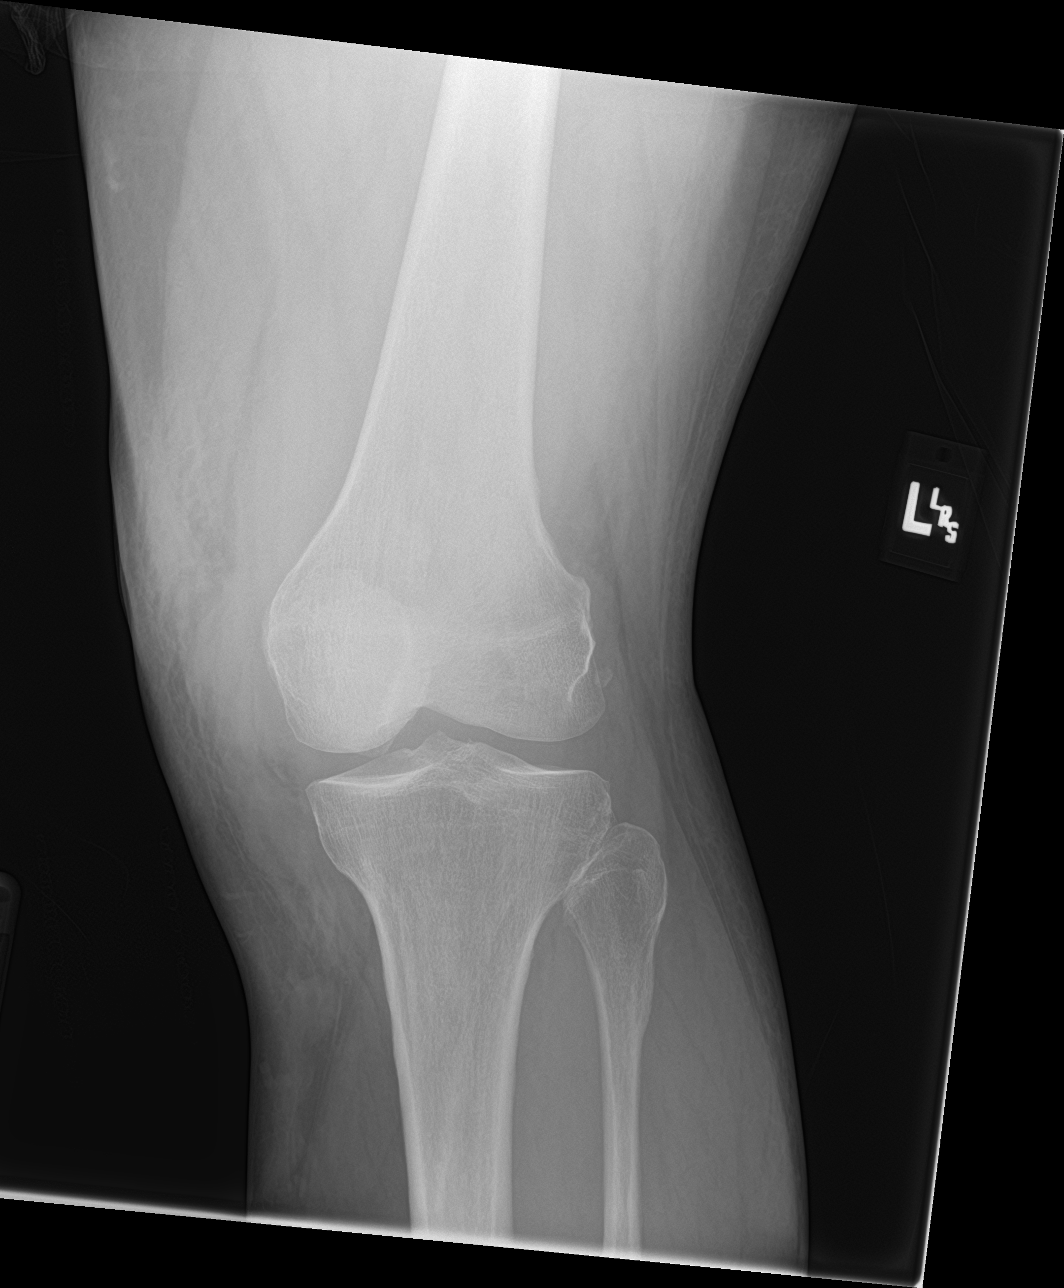

[knee obl (2 of 2)]
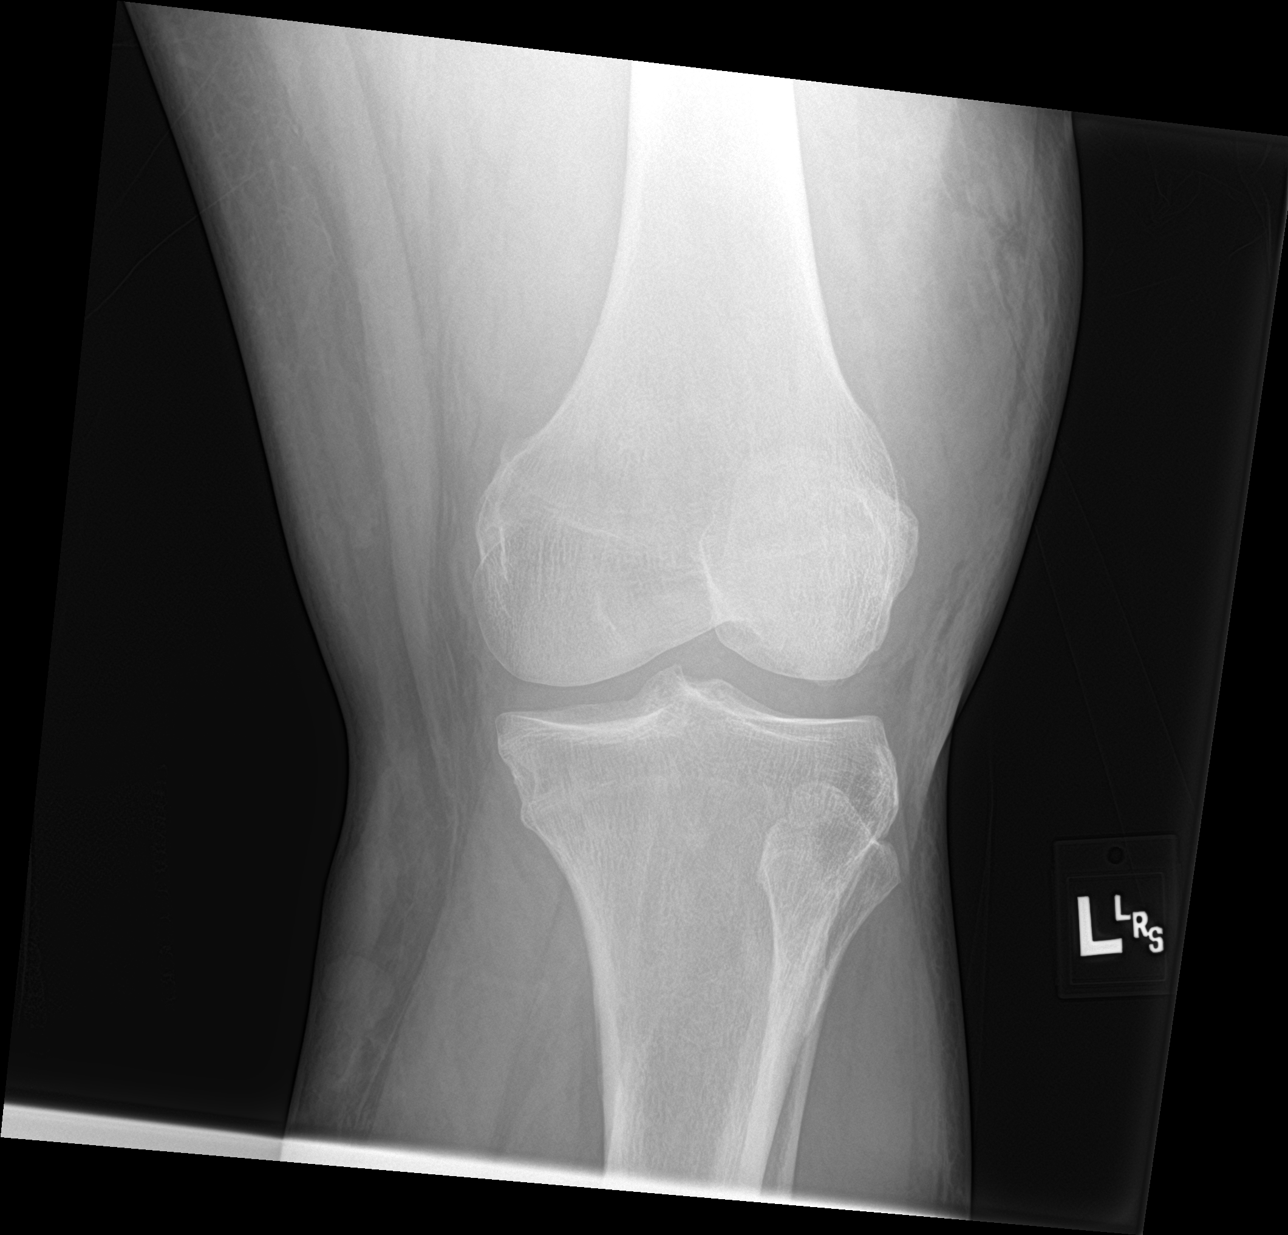

[4 of 4 positions shown; findings below may reference images not displayed]

FINDINGS: No acute displaced fracture or malalignment is seen. Prominent soft
tissue swelling anterior and superior to the patella with probable
moderate knee effusion. Mild patellofemoral degenerative change.
IMPRESSION: No definite acute osseous abnormality. Significant soft tissue
swelling anterior and superior to the patella with probable knee
effusion.

## 2020-11-26 ENCOUNTER — Ambulatory Visit: Payer: Medicare HMO | Admitting: Family Medicine

## 2020-12-27 NOTE — Progress Notes (Addendum)
[facility] [address], [place], [place] [address] [phone]  Date:  01/01/2021   Name:  Jason Obrien   DOB:  [birth year]   MRN:  [id number]  PCP:  Darreld Mclean, MD    Chief Complaint: No chief complaint on file.   History of Present Illness:  Jason Obrien is a 70 y.o. very pleasant male patient who presents with the following:  Periodic follow-up visit today, last seen by myself in July History of dyslipidemia, prediabetes, prostate cancer status post prostatectomy 2017, left quad tear 2020 which was repaired in August 2020  He is retired from his work as a Dietitian Married to [place], they have 2 children and 2 grandchildren who live in [place]   Their DIL recently had some surgery for her trigeminal nerve- she is doing much better now They recently were in [place] for 3 weeks to help out. Grands are 5 and 76 yo now   covid booster done -we discussed fourth dose, he plans to get this taken care of Flu vaccine done  Colon cancer screen due this fall- he is aware  shingrix is UTD  Seen by optho in January for hypertensive retinopathy  Urologist is Dr Nevada Crane- most recent visit in August: Assessment: Prostate cancer s/p RP and adj RT Bilateral ureteal strictures Nephrolithiasis UTI- resolved Plan: Patient will continue stone prevention measures We will continue yearly follow-up with PSA and renal ultrasound prior to visit   Asa Metformin Lovastatin  Lab Results  Component Value Date   HGBA1C 6.2 04/03/2020   He is trying to walk for exercise 2.5 miles daily, and a longer walk up to 6 miles once a week  Wt Readings from Last 3 Encounters:  01/01/21 261 lb (118.4 kg)  05/19/20 260 lb 6 oz (118.1 kg)  05/02/20 (!) 261 lb 6 oz (118.6 kg)   No CP or SOB with exercise  He does check his BP at home- it tends to run under 135/80 generally For the time being he does  not prefer to start on medication for BP   Patient Active Problem List   Diagnosis Date Noted  . Left quad rutpure 05/08/2019  . Dyslipidemia 06/14/2017  . Prostate cancer ([place]) 07/21/2016  . Pre-diabetes 07/21/2016  . Nephrolithiasis 07/07/2016  . Elevated PSA 05/31/2016  . Overweight 05/20/2016    Past Medical History:  Diagnosis Date  . Arthritis   . Cancer [facility])    Prostate Cancer for SX in Dec.2017  . Chicken pox    childhood  . History of kidney stones 03/2018  . Kidney stones    In 2013 or 2014  . Pre-diabetes     Past Surgical History:  Procedure Laterality Date  . COLONOSCOPY  01/30/2003  . OTHER SURGICAL HISTORY     Right arm surgery for compound fracture   . QUADRICEPS TENDON REPAIR Left 05/08/2019   Procedure: REPAIR QUADRICEP TENDON;  Surgeon: Paralee Cancel, MD;  Location: [facility];  Service: Orthopedics;  Laterality: Left;  90 mins  . robotic assisted lap radical prostatectomy N/A 09/14/2016   prostate cancer- done pe [facility], Dr. Nevada Crane    Social History   Tobacco Use  . Smoking status: Never Smoker  . Smokeless tobacco: Never Used  Vaping Use  . Vaping Use: Never used  Substance Use Topics  . Alcohol use: Yes    Alcohol/week: 3.0 standard drinks  Types: 3 Shots of liquor per week    Comment: Social only   . Drug use: No    Family History  Problem Relation Age of Onset  . Hypertension Father   . Heart failure Father        died age 29  . Colon cancer Paternal Grandmother     No Known Allergies  Medication list has been reviewed and updated.  Current Outpatient Medications on File Prior to Visit  Medication Sig Dispense Refill  . aspirin EC 81 MG tablet Take 81 mg by mouth daily. Swallow whole.    . Glucosamine-Chondroit-Vit C-Mn (GLUCOSAMINE 1500 COMPLEX) CAPS Take by mouth.    . lovastatin (MEVACOR) 40 MG tablet Take 1 tablet (40 mg total) by mouth daily. 90 tablet 3  . metFORMIN (GLUCOPHAGE) 500 MG tablet Take 1 tablet (500 mg total) by  mouth daily. 90 tablet 3  . Multiple Vitamins-Minerals (ONE-A-DAY MENS 50+ ADVANTAGE PO) Take 1 capsule by mouth daily.    . Omega-3 Fatty Acids (FISH OIL ULTRA) 1400 MG CAPS Take 1,400 mg by mouth daily.      No current facility-administered medications on file prior to visit.    Review of Systems:  As per HPI- otherwise negative.   Physical Examination: Vitals:   01/01/21 0854  BP: 134/80  Pulse: 71  Resp: 18  Temp: (!) 97.3 F (36.3 C)  SpO2: 97%   Vitals:   01/01/21 0854  Weight: 261 lb (118.4 kg)  Height: 5\' 9"  (1.753 m)   Body mass index is 38.54 kg/m. Ideal Body Weight: Weight in (lb) to have BMI = 25: 168.9  GEN: no acute distress.  Obese, looks well  HEENT: Atraumatic, Normocephalic.  Ears and Nose: No external deformity. CV: RRR, No M/G/R. No JVD. No thrill. No extra heart sounds. PULM: CTA B, no wheezes, crackles, rhonchi. No retractions. No resp. distress. No accessory muscle use. ABD: S, NT, ND, +BS. No rebound. No HSM. EXTR: No c/c/e PSYCH: Normally interactive. Conversant.    Assessment and Plan: Pre-diabetes - Plan: Basic metabolic panel, Hemoglobin A1c  Dyslipidemia  Elevated BP without diagnosis of hypertension  Jason Obrien here today for a follow-up visit.  Prediabetes on Metformin.  We will check on A1c today, basic metabolic profile.  We discussed his blood pressure, it is borderline I offered to start him on medication, for the time being he prefers to monitor blood pressure at home, he will let me know if oftentimes higher than 140/85. Will plan further follow- up pending labs. Lipids were checked in July, he is tolerating lovastatin without any adverse side effects This visit occurred during the SARS-CoV-2 public health emergency.  Safety protocols were in place, including screening questions prior to the visit, additional usage of staff PPE, and extensive cleaning of exam room while observing appropriate contact time as indicated for disinfecting  solutions.    Signed Lamar Blinks, MD  Received labs as below, message to patient  Results for orders placed or performed in visit on 67/89/38  Basic metabolic panel  Result Value Ref Range   Sodium 141 135 - 145 mEq/L   Potassium 4.6 3.5 - 5.1 mEq/L   Chloride 106 96 - 112 mEq/L   CO2 29 19 - 32 mEq/L   Glucose, Bld 134 (H) 70 - 99 mg/dL   BUN 25 (H) 6 - 23 mg/dL   Creatinine, Ser 0.90 0.40 - 1.50 mg/dL   GFR 86.14 >60.00 mL/min   Calcium 9.0 8.4 -  10.5 mg/dL  Hemoglobin A1c  Result Value Ref Range   Hgb A1c MFr Bld 6.1 4.6 - 6.5 %

## 2020-12-27 NOTE — Patient Instructions (Addendum)
Good to see you again today-  I will be in touch with your labs asap  Assuming all is well we can plan to visit in 6 months   It does look like we are good for a 4th dose of covid vaccine for people ages 2 and older- please let me know how this goes for you!

## 2021-01-01 ENCOUNTER — Encounter: Payer: Self-pay | Admitting: Family Medicine

## 2021-01-01 ENCOUNTER — Ambulatory Visit (INDEPENDENT_AMBULATORY_CARE_PROVIDER_SITE_OTHER): Payer: Medicare HMO | Admitting: Family Medicine

## 2021-01-01 ENCOUNTER — Other Ambulatory Visit: Payer: Self-pay

## 2021-01-01 VITALS — BP 134/80 | HR 71 | Temp 97.3°F | Resp 18 | Ht 69.0 in | Wt 261.0 lb

## 2021-01-01 DIAGNOSIS — R7303 Prediabetes: Secondary | ICD-10-CM | POA: Diagnosis not present

## 2021-01-01 DIAGNOSIS — E785 Hyperlipidemia, unspecified: Secondary | ICD-10-CM

## 2021-01-01 DIAGNOSIS — R030 Elevated blood-pressure reading, without diagnosis of hypertension: Secondary | ICD-10-CM | POA: Diagnosis not present

## 2021-01-01 LAB — HEMOGLOBIN A1C: Hgb A1c MFr Bld: 6.1 % (ref 4.6–6.5)

## 2021-01-01 LAB — BASIC METABOLIC PANEL
BUN: 25 mg/dL — ABNORMAL HIGH (ref 6–23)
CO2: 29 mEq/L (ref 19–32)
Calcium: 9 mg/dL (ref 8.4–10.5)
Chloride: 106 mEq/L (ref 96–112)
Creatinine, Ser: 0.9 mg/dL (ref 0.40–1.50)
GFR: 86.14 mL/min (ref >60.00)
Glucose, Bld: 134 mg/dL — ABNORMAL HIGH (ref 70–99)
Potassium: 4.6 mEq/L (ref 3.5–5.1)
Sodium: 141 mEq/L (ref 135–145)

## 2021-02-08 ENCOUNTER — Encounter: Payer: Self-pay | Admitting: Family Medicine

## 2021-03-27 ENCOUNTER — Telehealth: Payer: Self-pay | Admitting: Family Medicine

## 2021-03-27 NOTE — Telephone Encounter (Signed)
Copied from [place] [phone]. Topic: Medicare AWV >> Mar 27, 2021  1:18 PM Harris-Coley, Hannah Beat wrote: Reason for CRM: Left message for patient to schedule Annual Wellness Visit.  Please schedule with Health Nurse Advisor Augustine Radar. at [facility].

## 2021-04-01 NOTE — Progress Notes (Signed)
Subjective:   Jason Obrien is a 70 y.o. male who presents for Medicare Annual/Subsequent preventive examination.  I connected with Raiyan today by telephone and verified that I am speaking with the correct person using two identifiers. Location patient: home Location provider: work Persons participating in the virtual visit: patient, Marine scientist.    I discussed the limitations, risks, security and privacy concerns of performing an evaluation and management service by telephone and the availability of in person appointments. I also discussed with the patient that there may be a patient responsible charge related to this service. The patient expressed understanding and verbally consented to this telephonic visit.    Interactive audio and video telecommunications were attempted between this provider and patient, however failed, due to patient having technical difficulties OR patient did not have access to video capability.  We continued and completed visit with audio only.  Some vital signs may be absent or patient reported.   Time Spent with patient on telephone encounter: 20 minutes   Review of Systems     Cardiac Risk Factors include: advanced age (>87men, >29 women);dyslipidemia;obesity (BMI >30kg/m2);male gender     Objective:    Today's Vitals   04/02/21 1143  Weight: 245 lb (111.1 kg)  Height: 5\' 9"  (1.753 m)   Body mass index is 36.18 kg/m.  Advanced Directives 04/02/2021 04/01/2020 05/08/2019 05/07/2019 08/02/2016  Does Patient Have a Medical Advance Directive? No No No No No  Does patient want to make changes to medical advance directive? - No - Patient declined - - -  Would patient like information on creating a medical advance directive? No - Patient declined - No - Patient declined No - Patient declined -    Current Medications (verified) Outpatient Encounter Medications as of 04/02/2021  Medication Sig   aspirin EC 81 MG tablet Take 81 mg by mouth daily. Swallow whole.    Glucosamine-Chondroit-Vit C-Mn (GLUCOSAMINE 1500 COMPLEX) CAPS Take by mouth.   lovastatin (MEVACOR) 40 MG tablet Take 1 tablet (40 mg total) by mouth daily.   metFORMIN (GLUCOPHAGE) 500 MG tablet Take 1 tablet (500 mg total) by mouth daily.   Multiple Vitamins-Minerals (ONE-A-DAY MENS 50+ ADVANTAGE PO) Take 1 capsule by mouth daily.   Omega-3 Fatty Acids (FISH OIL ULTRA) 1400 MG CAPS Take 1,400 mg by mouth daily.    No facility-administered encounter medications on file as of 04/02/2021.    Allergies (verified) Patient has no known allergies.   History: Past Medical History:  Diagnosis Date   Arthritis    Cancer ([place])    Prostate Cancer for SX in Dec.2017   Chicken pox    childhood   History of kidney stones 03/2018   Kidney stones    In 2013 or 2014   Pre-diabetes    Past Surgical History:  Procedure Laterality Date   COLONOSCOPY  01/30/2003   OTHER SURGICAL HISTORY     Right arm surgery for compound fracture    QUADRICEPS TENDON REPAIR Left 05/08/2019   Procedure: REPAIR QUADRICEP TENDON;  Surgeon: Paralee Cancel, MD;  Location: [facility];  Service: Orthopedics;  Laterality: Left;  90 mins   robotic assisted lap radical prostatectomy N/A 09/14/2016   prostate cancer- done pe [facility], Dr. Nevada Crane   Family History  Problem Relation Age of Onset   Hypertension Father    Heart failure Father        died age 80   Colon cancer Paternal Grandmother    Social History   Socioeconomic  History   Marital status: Married    Spouse name: Not on file   Number of children: Not on file   Years of education: Not on file   Highest education level: Not on file  Occupational History   Not on file  Tobacco Use   Smoking status: Never   Smokeless tobacco: Never  Vaping Use   Vaping Use: Never used  Substance and Sexual Activity   Alcohol use: Yes    Alcohol/week: 3.0 standard drinks    Types: 3 Shots of liquor per week    Comment: Social only    Drug use: No   Sexual activity: Not on  file  Other Topics Concern   Not on file  Social History Narrative   Not on file   Social Determinants of Health   Financial Resource Strain: Low Risk    Difficulty of Paying Living Expenses: Not hard at all  Food Insecurity: No Food Insecurity   Worried About Charity fundraiser in the Last Year: Never true   [place] in the Last Year: Never true  Transportation Needs: No Transportation Needs   Lack of Transportation (Medical): No   Lack of Transportation (Non-Medical): No  Physical Activity: Sufficiently Active   Days of Exercise per Week: 5 days   Minutes of Exercise per Session: 40 min  Stress: No Stress Concern Present   Feeling of Stress : Not at all  Social Connections: Socially Integrated   Frequency of Communication with Friends and Family: More than three times a week   Frequency of Social Gatherings with Friends and Family: More than three times a week   Attends Religious Services: More than 4 times per year   Active Member of [employer] or Organizations: Yes   Attends Archivist Meetings: 1 to 4 times per year   Marital Status: Married    Tobacco Counseling Counseling given: Not Answered   Clinical Intake:  Pre-visit preparation completed: Yes  Pain : No/denies pain     Nutritional Status: BMI > 30  Obese Nutritional Risks: None Diabetes: No  How often do you need to have someone help you when you read instructions, pamphlets, or other written materials from your doctor or pharmacy?: 1 - Never  Diabetic?No  Interpreter Needed?: No  Information entered by :: Caroleen Hamman LPN   Activities of Daily Living In your present state of health, do you have any difficulty performing the following activities: 04/02/2021  Hearing? N  Vision? N  Difficulty concentrating or making decisions? N  Walking or climbing stairs? N  Dressing or bathing? N  Doing errands, shopping? N  Preparing Food and eating ? N  Using the Toilet? N  In the past six  months, have you accidently leaked urine? N  Do you have problems with loss of bowel control? N  Managing your Medications? N  Managing your Finances? N  Housekeeping or managing your Housekeeping? N  Some recent data might be hidden     Patient Care Team: Copland, Gay Filler, MD as PCP - General (Family Medicine)  Indicate any recent Medical Services you may have received from other than [facility] providers in the past year (date may be approximate).     Assessment:   This is a routine wellness examination for [place].  Hearing/Vision screen Hearing Screening - Comments:: Ringing in th ears but no hearing loss Vision Screening - Comments:: Last eye exam-10/2020  Dietary issues and exercise activities discussed: Current Exercise  Habits: Home exercise routine, Type of exercise: walking;stretching, Time (Minutes): 40, Frequency (Times/Week): 5, Weekly Exercise (Minutes/Week): 200, Intensity: Mild, Exercise limited by: None identified   Goals Addressed             This Visit's Progress    Maintain healthy active lifestyle.   On track    Patient Stated       Increase stregnth training        Depression Screen PHQ 2/9 Scores 04/02/2021 04/01/2020 06/13/2017 05/20/2016  PHQ - 2 Score 0 0 0 0    Fall Risk Fall Risk  04/02/2021 04/01/2020 06/13/2017 05/20/2016  Falls in the past year? 0 1 No No  Number falls in past yr: 0 0 - -  Injury with Fall? 0 1 - -  Follow up Falls prevention discussed Education provided;Falls prevention discussed - -    FALL RISK PREVENTION PERTAINING TO THE HOME:  Any stairs in or around the home? Yes  If so, are there any without handrails? No  Home free of loose throw rugs in walkways, pet beds, electrical cords, etc? Yes  Adequate lighting in your home to reduce risk of falls? Yes   ASSISTIVE DEVICES UTILIZED TO PREVENT FALLS:  Life alert? No  Use of a cane, walker or w/c? No  Grab bars in the bathroom? Yes  Shower chair or bench in shower? No   Elevated toilet seat or a handicapped toilet? No   TIMED UP AND GO:  Was the test performed? No . Phone visit   Cognitive Function:Normal cognitive status assessed by this Nurse Health Advisor. No abnormalities found.          Immunizations Immunization History  Administered Date(s) Administered   Fluad Quad(high Dose 65+) 07/04/2019   Influenza, High Dose Seasonal PF 07/21/2016, 06/13/2017, 09/11/2018   Influenza-Unspecified 08/04/2020   Moderna Sars-Covid-2 Vaccination 11/03/2019, 12/03/2019, 08/24/2020   Pneumococcal Conjugate-13 05/20/2016   Pneumococcal Polysaccharide-23 06/13/2017   Tdap 10/04/2014   Zoster Recombinat (Shingrix) 01/19/2019, 04/09/2019    TDAP status: Up to date  Flu Vaccine status: Up to date  Pneumococcal vaccine status: Up to date  Covid-19 vaccine status: Completed vaccines  Qualifies for Shingles Vaccine? No   Zostavax completed No   Shingrix Completed?: Yes  Screening Tests Health Maintenance  Topic Date Due   COVID-19 Vaccine (4 - Booster for Moderna series) 11/24/2020   INFLUENZA VACCINE  05/04/2021   COLONOSCOPY (Pts 45-40yrs Insurance coverage will need to be confirmed)  08/16/2021   TETANUS/TDAP  10/04/2024   Hepatitis C Screening  Completed   PNA vac Low Risk Adult  Completed   Zoster Vaccines- Shingrix  Completed   HPV VACCINES  Aged Out    Health Maintenance  Health Maintenance Due  Topic Date Due   COVID-19 Vaccine (4 - Booster for Moderna series) 11/24/2020    Colorectal cancer screening: Type of screening: Colonoscopy. Completed 08/16/2016. Repeat every 5 years  Lung Cancer Screening: (Low Dose CT Chest recommended if Age 105-80 years, 30 pack-year currently smoking OR have quit w/in 15years.) does not qualify.    Additional Screening:  Hepatitis C Screening: Completed 05/20/2016  Vision Screening: Recommended annual ophthalmology exams for early detection of glaucoma and other disorders of the eye. Is the  patient up to date with their annual eye exam?  Yes  Who is the provider or what is the name of the office in which the patient attends annual eye exams? Dr. Sabra Heck   Dental Screening: Recommended annual dental  exams for proper oral hygiene  Community Resource Referral / Chronic Care Management: CRR required this visit?  No   CCM required this visit?  No      Plan:     I have personally reviewed and noted the following in the patient's chart:   Medical and social history Use of alcohol, tobacco or illicit drugs  Current medications and supplements including opioid prescriptions. Patient is not currently taking opioid prescriptions. Functional ability and status Nutritional status Physical activity Advanced directives List of other physicians Hospitalizations, surgeries, and ER visits in previous 12 months Vitals Screenings to include cognitive, depression, and falls Referrals and appointments  In addition, I have reviewed and discussed with patient certain preventive protocols, quality metrics, and best practice recommendations. A written personalized care plan for preventive services as well as general preventive health recommendations were provided to patient.   Due to this being a telephonic visit, the after visit summary with patients personalized plan was offered to patient via mail or my-chart. Patient would like to access on my-chart.   Marta Antu, LPN   5/63/1497  Nurse Health Advisor  Nurse Notes: None

## 2021-04-02 ENCOUNTER — Ambulatory Visit (INDEPENDENT_AMBULATORY_CARE_PROVIDER_SITE_OTHER): Payer: Medicare HMO

## 2021-04-02 VITALS — Ht 69.0 in | Wt 245.0 lb

## 2021-04-02 DIAGNOSIS — Z0000 Encounter for general adult medical examination without abnormal findings: Secondary | ICD-10-CM | POA: Diagnosis not present

## 2021-04-02 NOTE — Patient Instructions (Signed)
Jason Obrien , Thank you for taking time to complete your Medicare Wellness Visit. I appreciate your ongoing commitment to your health goals. Please review the following plan we discussed and let me know if I can assist you in the future.   Screening recommendations/referrals: Colonoscopy: Completed 08/16/2016-Due 08/16/2021 Recommended yearly ophthalmology/optometry visit for glaucoma screening and checkup Recommended yearly dental visit for hygiene and checkup  Vaccinations: Influenza vaccine: Up to date Pneumococcal vaccine: Up to date Tdap vaccine: Up to date-Due-10/04/2024 Shingles vaccine: Completed vaccines   Covid-19: Up to date  Advanced directives: Information declined today.  Conditions/risks identified: See problem list  Next appointment: Follow up in one year for your annual wellness visit. 04/12/2022 @ 9:00  Preventive Care 65 Years and Older, Male Preventive care refers to lifestyle choices and visits with your health care provider that can promote health and wellness. What does preventive care include? A yearly physical exam. This is also called an annual well check. Dental exams once or twice a year. Routine eye exams. Ask your health care provider how often you should have your eyes checked. Personal lifestyle choices, including: Daily care of your teeth and gums. Regular physical activity. Eating a healthy diet. Avoiding tobacco and drug use. Limiting alcohol use. Practicing safe sex. Taking low doses of aspirin every day. Taking vitamin and mineral supplements as recommended by your health care provider. What happens during an annual well check? The services and screenings done by your health care provider during your annual well check will depend on your age, overall health, lifestyle risk factors, and family history of disease. Counseling  Your health care provider may ask you questions about your: Alcohol use. Tobacco use. Drug use. Emotional  well-being. Home and relationship well-being. Sexual activity. Eating habits. History of falls. Memory and ability to understand (cognition). Work and work Statistician. Screening  You may have the following tests or measurements: Height, weight, and BMI. Blood pressure. Lipid and cholesterol levels. These may be checked every 5 years, or more frequently if you are over 21 years old. Skin check. Lung cancer screening. You may have this screening every year starting at age 60 if you have a 30-pack-year history of smoking and currently smoke or have quit within the past 15 years. Fecal occult blood test (FOBT) of the stool. You may have this test every year starting at age 97. Flexible sigmoidoscopy or colonoscopy. You may have a sigmoidoscopy every 5 years or a colonoscopy every 10 years starting at age 64. Prostate cancer screening. Recommendations will vary depending on your family history and other risks. Hepatitis C blood test. Hepatitis B blood test. Sexually transmitted disease (STD) testing. Diabetes screening. This is done by checking your blood sugar (glucose) after you have not eaten for a while (fasting). You may have this done every 1-3 years. Abdominal aortic aneurysm (AAA) screening. You may need this if you are a current or former smoker. Osteoporosis. You may be screened starting at age 44 if you are at high risk. Talk with your health care provider about your test results, treatment options, and if necessary, the need for more tests. Vaccines  Your health care provider may recommend certain vaccines, such as: Influenza vaccine. This is recommended every year. Tetanus, diphtheria, and acellular pertussis (Tdap, Td) vaccine. You may need a Td booster every 10 years. Zoster vaccine. You may need this after age 8. Pneumococcal 13-valent conjugate (PCV13) vaccine. One dose is recommended after age 74. Pneumococcal polysaccharide (PPSV23) vaccine. One dose is  recommended after  age 9. Talk to your health care provider about which screenings and vaccines you need and how often you need them. This information is not intended to replace advice given to you by your health care provider. Make sure you discuss any questions you have with your health care provider. Document Released: 10/17/2015 Document Revised: 06/09/2016 Document Reviewed: 07/22/2015 Elsevier Interactive Patient Education  2017 [place] Prevention in the Home Falls can cause injuries. They can happen to people of all ages. There are many things you can do to make your home safe and to help prevent falls. What can I do on the outside of my home? Regularly fix the edges of walkways and driveways and fix any cracks. Remove anything that might make you trip as you walk through a door, such as a raised step or threshold. Trim any bushes or trees on the path to your home. Use bright outdoor lighting. Clear any walking paths of anything that might make someone trip, such as rocks or tools. Regularly check to see if handrails are loose or broken. Make sure that both sides of any steps have handrails. Any raised decks and porches should have guardrails on the edges. Have any leaves, snow, or ice cleared regularly. Use sand or salt on walking paths during winter. Clean up any spills in your garage right away. This includes oil or grease spills. What can I do in the bathroom? Use night lights. Install grab bars by the toilet and in the tub and shower. Do not use towel bars as grab bars. Use non-skid mats or decals in the tub or shower. If you need to sit down in the shower, use a plastic, non-slip stool. Keep the floor dry. Clean up any water that spills on the floor as soon as it happens. Remove soap buildup in the tub or shower regularly. Attach bath mats securely with double-sided non-slip rug tape. Do not have throw rugs and other things on the floor that can make you trip. What can I do in the  bedroom? Use night lights. Make sure that you have a light by your bed that is easy to reach. Do not use any sheets or blankets that are too big for your bed. They should not hang down onto the floor. Have a firm chair that has side arms. You can use this for support while you get dressed. Do not have throw rugs and other things on the floor that can make you trip. What can I do in the kitchen? Clean up any spills right away. Avoid walking on wet floors. Keep items that you use a lot in easy-to-reach places. If you need to reach something above you, use a strong step stool that has a grab bar. Keep electrical cords out of the way. Do not use floor polish or wax that makes floors slippery. If you must use wax, use non-skid floor wax. Do not have throw rugs and other things on the floor that can make you trip. What can I do with my stairs? Do not leave any items on the stairs. Make sure that there are handrails on both sides of the stairs and use them. Fix handrails that are broken or loose. Make sure that handrails are as long as the stairways. Check any carpeting to make sure that it is firmly attached to the stairs. Fix any carpet that is loose or worn. Avoid having throw rugs at the top or bottom of the stairs. If  you do have throw rugs, attach them to the floor with carpet tape. Make sure that you have a light switch at the top of the stairs and the bottom of the stairs. If you do not have them, ask someone to add them for you. What else can I do to help prevent falls? Wear shoes that: Do not have high heels. Have rubber bottoms. Are comfortable and fit you well. Are closed at the toe. Do not wear sandals. If you use a stepladder: Make sure that it is fully opened. Do not climb a closed stepladder. Make sure that both sides of the stepladder are locked into place. Ask someone to hold it for you, if possible. Clearly mark and make sure that you can see: Any grab bars or  handrails. First and last steps. Where the edge of each step is. Use tools that help you move around (mobility aids) if they are needed. These include: Canes. Walkers. Scooters. Crutches. Turn on the lights when you go into a dark area. Replace any light bulbs as soon as they burn out. Set up your furniture so you have a clear path. Avoid moving your furniture around. If any of your floors are uneven, fix them. If there are any pets around you, be aware of where they are. Review your medicines with your doctor. Some medicines can make you feel dizzy. This can increase your chance of falling. Ask your doctor what other things that you can do to help prevent falls. This information is not intended to replace advice given to you by your health care provider. Make sure you discuss any questions you have with your health care provider. Document Released: 07/17/2009 Document Revised: 02/26/2016 Document Reviewed: 10/25/2014 Elsevier Interactive Patient Education  2017 [employer].

## 2021-05-13 ENCOUNTER — Other Ambulatory Visit: Payer: Self-pay | Admitting: Family Medicine

## 2021-05-13 DIAGNOSIS — E785 Hyperlipidemia, unspecified: Secondary | ICD-10-CM

## 2021-05-13 DIAGNOSIS — R7303 Prediabetes: Secondary | ICD-10-CM

## 2021-06-26 NOTE — Patient Instructions (Addendum)
Good to see you again today, I will be in touch your labs.  Assuming all is well, please see me in 6 months  Consider getting the new COVID-19 booster if not already Flu shot today Will check PSA for you to have with you when you see Dutch Gray  Your BP is a bit elevated- please check some readings at home over the next 2-3 weeks and email me a list- we can start a low dose of medication if needed  Call [facility] about your colon if they don't call you

## 2021-06-26 NOTE — Progress Notes (Addendum)
[facility] [address], [place], [place] [address] [phone]  Date:  06/29/2021   Name:  Jason Obrien   DOB:  10-Jan-1951   MRN:  [id number]  PCP:  Darreld Mclean, MD    Chief Complaint: 6 month follow up (Concerns/ questions: none/Flu shot today: yes/)   History of Present Illness:  Jason Obrien is a 70 y.o. very pleasant male patient who presents with the following:  Patient seen today for periodic follow-up Most recent visit with myself was in March  History of dyslipidemia, prediabetes, prostate cancer status post prostatectomy 2017, left quad tear 2020 which was repaired in August 2020   He is retired from his work as a Dietitian Married to [place], they have 2 children and 2 grandchildren who live in [place] are 6 and 71 yo     COVID-19 booster- plans to do at pharmacy  Flu vaccine- today  Colonoscopy is due this year; he is aware and will reach out if they don't call him  He is followed by urology annually- he will be seeing Dutch Gray as his last urologist has moved.  Will check a PSA level for him to have with him  Shingrix completed  Aspirin Lovastatin Metformin  BMP, A1c only done in March He is fasting today  He has been working on his weight and has lost a good amount as below They changed their diet- went more low carb, and he is exercising   Wt Readings from Last 3 Encounters:  06/29/21 236 lb 3.2 oz (107.1 kg)  04/02/21 245 lb (111.1 kg)  01/01/21 261 lb (118.4 kg)   BP Readings from Last 3 Encounters:  06/29/21 (!) 144/84  01/01/21 134/80  05/19/20 (!) 152/78   He does have a BP cuff at home and will check some readings   Patient Active Problem List   Diagnosis Date Noted   Left quad rutpure 05/08/2019   Dyslipidemia 06/14/2017   Prostate cancer ([place]) 07/21/2016   Pre-diabetes 07/21/2016   Nephrolithiasis 07/07/2016   Elevated PSA  05/31/2016   Overweight 05/20/2016    Past Medical History:  Diagnosis Date   Arthritis    Cancer [facility])    Prostate Cancer for SX in Dec.2017   Chicken pox    childhood   History of kidney stones 03/2018   Kidney stones    In 2013 or 2014   Pre-diabetes     Past Surgical History:  Procedure Laterality Date   COLONOSCOPY  01/30/2003   OTHER SURGICAL HISTORY     Right arm surgery for compound fracture    QUADRICEPS TENDON REPAIR Left 05/08/2019   Procedure: REPAIR QUADRICEP TENDON;  Surgeon: Paralee Cancel, MD;  Location: [facility];  Service: Orthopedics;  Laterality: Left;  90 mins   robotic assisted lap radical prostatectomy N/A 09/14/2016   prostate cancer- done pe [facility], Dr. Nevada Crane    Social History   Tobacco Use   Smoking status: Never   Smokeless tobacco: Never  Vaping Use   Vaping Use: Never used  Substance Use Topics   Alcohol use: Yes    Alcohol/week: 3.0 standard drinks    Types: 3 Shots of liquor per week    Comment: Social only    Drug use: No    Family History  Problem Relation Age of Onset   Hypertension Father    Heart failure Father  died age 71   Colon cancer Paternal Grandmother     No Known Allergies  Medication list has been reviewed and updated.  Current Outpatient Medications on File Prior to Visit  Medication Sig Dispense Refill   aspirin EC 81 MG tablet Take 81 mg by mouth daily. Swallow whole.     Glucosamine-Chondroit-Vit C-Mn (GLUCOSAMINE 1500 COMPLEX) CAPS Take by mouth.     lovastatin (MEVACOR) 40 MG tablet TAKE 1 TABLET BY MOUTH EVERY DAY 90 tablet 3   metFORMIN (GLUCOPHAGE) 500 MG tablet TAKE 1 TABLET BY MOUTH EVERY DAY 90 tablet 3   Multiple Vitamins-Minerals (ONE-A-DAY MENS 50+ ADVANTAGE PO) Take 1 capsule by mouth daily.     Omega-3 Fatty Acids (FISH OIL ULTRA) 1400 MG CAPS Take 1,400 mg by mouth daily.      No current facility-administered medications on file prior to visit.    Review of Systems:  As per HPI- otherwise  negative.   Physical Examination: Vitals:   06/29/21 0853  BP: (!) 144/84  Pulse: 64  Resp: 18  Temp: 98.3 F (36.8 C)  SpO2: 97%   Vitals:   06/29/21 0853  Weight: 236 lb 3.2 oz (107.1 kg)  Height: 5\' 9"  (1.753 m)   Body mass index is 34.88 kg/m. Ideal Body Weight: Weight in (lb) to have BMI = 25: 168.9  GEN: no acute distress. HEENT: Atraumatic, Normocephalic.  Ears and Nose: No external deformity. CV: RRR, No M/G/R. No JVD. No thrill. No extra heart sounds. PULM: CTA B, no wheezes, crackles, rhonchi. No retractions. No resp. distress. No accessory muscle use. ABD: S, NT, ND, +BS. No rebound. No HSM. EXTR: No c/c/e PSYCH: Normally interactive. Conversant.    Assessment and Plan: Elevated BP without diagnosis of hypertension - Plan: CBC  Pre-diabetes - Plan: Comprehensive metabolic panel, Hemoglobin A1c  Dyslipidemia - Plan: Lipid panel  Prostate cancer ([place]) - Plan: PSA  Screening for deficiency anemia - Plan: CBC  Need for influenza vaccination - Plan: Flu Vaccine QUAD High Dose(Fluad)  Weight loss  BP a bit high today- he will monitor at home and send me some readings. Can start on low dose meds if needed He has lost weight- great job!  He will continue to work on this  Will plan further follow- up pending labs.   This visit occurred during the SARS-CoV-2 public health emergency.  Safety protocols were in place, including screening questions prior to the visit, additional usage of staff PPE, and extensive cleaning of exam room while observing appropriate contact time as indicated for disinfecting solutions.   Signed Lamar Blinks, MD  Addendum 9/27, received labs as below.  Message to patient  Results for orders placed or performed in visit on 06/29/21  CBC  Result Value Ref Range   WBC 3.8 (L) 4.0 - 10.5 K/uL   RBC 4.03 (L) 4.22 - 5.81 Mil/uL   Platelets 228.0 150.0 - 400.0 K/uL   Hemoglobin 13.1 13.0 - 17.0 g/dL   HCT 40.0 39.0 - 52.0 %   MCV  99.1 78.0 - 100.0 fl   MCHC 32.9 30.0 - 36.0 g/dL   RDW 14.5 11.5 - 15.5 %  Comprehensive metabolic panel  Result Value Ref Range   Sodium 141 135 - 145 mEq/L   Potassium 4.4 3.5 - 5.1 mEq/L   Chloride 104 96 - 112 mEq/L   CO2 30 19 - 32 mEq/L   Glucose, Bld 111 (H) 70 - 99 mg/dL   BUN 21 6 -  23 mg/dL   Creatinine, Ser 0.85 0.40 - 1.50 mg/dL   Total Bilirubin 0.9 0.2 - 1.2 mg/dL   Alkaline Phosphatase 68 39 - 117 U/L   AST 17 0 - 37 U/L   ALT 15 0 - 53 U/L   Total Protein 6.5 6.0 - 8.3 g/dL   Albumin 4.1 3.5 - 5.2 g/dL   GFR 87.34 >60.00 mL/min   Calcium 9.1 8.4 - 10.5 mg/dL  Hemoglobin A1c  Result Value Ref Range   Hgb A1c MFr Bld 6.0 4.6 - 6.5 %  Lipid panel  Result Value Ref Range   Cholesterol 160 0 - 200 mg/dL   Triglycerides 54.0 0.0 - 149.0 mg/dL   HDL 64.20 >39.00 mg/dL   VLDL 10.8 0.0 - 40.0 mg/dL   LDL Cholesterol 85 0 - 99 mg/dL   Total CHOL/HDL Ratio 2    NonHDL 96.11   PSA  Result Value Ref Range   PSA 0.00 Repeated and verified X2. (L) 0.10 - 4.00 ng/mL

## 2021-06-29 ENCOUNTER — Ambulatory Visit (INDEPENDENT_AMBULATORY_CARE_PROVIDER_SITE_OTHER): Payer: Medicare HMO | Admitting: Family Medicine

## 2021-06-29 ENCOUNTER — Encounter: Payer: Self-pay | Admitting: Family Medicine

## 2021-06-29 ENCOUNTER — Other Ambulatory Visit: Payer: Self-pay

## 2021-06-29 VITALS — BP 144/84 | HR 64 | Temp 98.3°F | Resp 18 | Ht 69.0 in | Wt 236.2 lb

## 2021-06-29 DIAGNOSIS — R030 Elevated blood-pressure reading, without diagnosis of hypertension: Secondary | ICD-10-CM

## 2021-06-29 DIAGNOSIS — E785 Hyperlipidemia, unspecified: Secondary | ICD-10-CM

## 2021-06-29 DIAGNOSIS — R634 Abnormal weight loss: Secondary | ICD-10-CM

## 2021-06-29 DIAGNOSIS — C61 Malignant neoplasm of prostate: Secondary | ICD-10-CM

## 2021-06-29 DIAGNOSIS — R7303 Prediabetes: Secondary | ICD-10-CM | POA: Diagnosis not present

## 2021-06-29 DIAGNOSIS — Z130 Encounter for screening for diseases of the blood and blood-forming organs and certain disorders involving the immune mechanism: Secondary | ICD-10-CM

## 2021-06-29 DIAGNOSIS — Z23 Encounter for immunization: Secondary | ICD-10-CM | POA: Diagnosis not present

## 2021-06-29 LAB — COMPREHENSIVE METABOLIC PANEL
ALT: 15 U/L (ref 0–53)
AST: 17 U/L (ref 0–37)
Albumin: 4.1 g/dL (ref 3.5–5.2)
Alkaline Phosphatase: 68 U/L (ref 39–117)
BUN: 21 mg/dL (ref 6–23)
CO2: 30 mEq/L (ref 19–32)
Calcium: 9.1 mg/dL (ref 8.4–10.5)
Chloride: 104 mEq/L (ref 96–112)
Creatinine, Ser: 0.85 mg/dL (ref 0.40–1.50)
GFR: 87.34 mL/min (ref >60.00)
Glucose, Bld: 111 mg/dL — ABNORMAL HIGH (ref 70–99)
Potassium: 4.4 mEq/L (ref 3.5–5.1)
Sodium: 141 mEq/L (ref 135–145)
Total Bilirubin: 0.9 mg/dL (ref 0.2–1.2)
Total Protein: 6.5 g/dL (ref 6.0–8.3)

## 2021-06-29 LAB — HEMOGLOBIN A1C: Hgb A1c MFr Bld: 6 % (ref 4.6–6.5)

## 2021-06-29 LAB — LIPID PANEL
Cholesterol: 160 mg/dL (ref 0–200)
HDL: 64.2 mg/dL (ref >39.00)
LDL Cholesterol: 85 mg/dL (ref 0–99)
NonHDL: 96.11
Total CHOL/HDL Ratio: 2
Triglycerides: 54 mg/dL (ref 0.0–149.0)
VLDL: 10.8 mg/dL (ref 0.0–40.0)

## 2021-06-29 LAB — CBC
HCT: 40 % (ref 39.0–52.0)
Hemoglobin: 13.1 g/dL (ref 13.0–17.0)
MCHC: 32.9 g/dL (ref 30.0–36.0)
MCV: 99.1 fl (ref 78.0–100.0)
Platelets: 228 10*3/uL (ref 150.0–400.0)
RBC: 4.03 Mil/uL — ABNORMAL LOW (ref 4.22–5.81)
RDW: 14.5 % (ref 11.5–15.5)
WBC: 3.8 10*3/uL — ABNORMAL LOW (ref 4.0–10.5)

## 2021-06-29 LAB — PSA: PSA: 0 ng/mL — ABNORMAL LOW (ref 0.10–4.00)

## 2021-06-30 ENCOUNTER — Encounter: Payer: Self-pay | Admitting: Family Medicine

## 2021-07-08 DIAGNOSIS — Z8546 Personal history of malignant neoplasm of prostate: Secondary | ICD-10-CM | POA: Diagnosis not present

## 2021-09-22 ENCOUNTER — Encounter: Payer: Self-pay | Admitting: Gastroenterology

## 2021-10-26 ENCOUNTER — Other Ambulatory Visit: Payer: Self-pay

## 2021-10-26 ENCOUNTER — Ambulatory Visit (AMBULATORY_SURGERY_CENTER): Payer: Medicare HMO | Admitting: *Deleted

## 2021-10-26 VITALS — Ht 69.0 in | Wt 230.0 lb

## 2021-10-26 DIAGNOSIS — Z800 Family history of malignant neoplasm of digestive organs: Secondary | ICD-10-CM

## 2021-10-26 MED ORDER — PLENVU 140 G PO SOLR: 1.0000 | Freq: Once | ORAL | 0 refills | Status: AC

## 2021-10-26 NOTE — Progress Notes (Signed)
No egg or soy allergy known to patient  No issues known to pt with past sedation with any surgeries or procedures Patient denies ever being told they had issues or difficulty with intubation  No FH of Malignant Hyperthermia Pt is not on diet pills Pt is not on  home 02  Pt is not on blood thinners  Pt denies issues with constipation  No A fib or A flutter  Pt is  vaccinated  for Covid   PLENVU Coupon to pt in PV today , Code to Pharmacy and  NO PA's for preps discussed with pt In PV today  Discussed with pt there will be an out-of-pocket cost for prep and that varies from $0 to 70 +  dollars - pt verbalized understanding   Due to the COVID-19 pandemic we are asking patients to follow certain guidelines in PV and the [place]   Pt aware of COVID protocols and [facility] guidelines   PV completed over the phone. Pt verified name, DOB, address and insurance during PV today.  Pt mailed instruction packet with copy of consent form to read and not return, and instructions.  Pt encouraged to call with questions or issues.  If pt has My chart, procedure instructions sent via My Chart

## 2021-11-26 ENCOUNTER — Encounter: Payer: Self-pay | Admitting: Gastroenterology

## 2021-11-30 ENCOUNTER — Other Ambulatory Visit: Payer: Self-pay

## 2021-11-30 ENCOUNTER — Ambulatory Visit (AMBULATORY_SURGERY_CENTER): Payer: Medicare HMO | Admitting: Gastroenterology

## 2021-11-30 ENCOUNTER — Encounter: Payer: Self-pay | Admitting: Gastroenterology

## 2021-11-30 VITALS — BP 126/78 | HR 61 | Temp 98.2°F | Resp 19 | Ht 69.0 in | Wt 230.0 lb

## 2021-11-30 DIAGNOSIS — Z8371 Family history of colonic polyps: Secondary | ICD-10-CM

## 2021-11-30 DIAGNOSIS — Z86010 Personal history of colonic polyps: Secondary | ICD-10-CM | POA: Diagnosis not present

## 2021-11-30 DIAGNOSIS — D122 Benign neoplasm of ascending colon: Secondary | ICD-10-CM | POA: Diagnosis not present

## 2021-11-30 DIAGNOSIS — D124 Benign neoplasm of descending colon: Secondary | ICD-10-CM | POA: Diagnosis not present

## 2021-11-30 DIAGNOSIS — D123 Benign neoplasm of transverse colon: Secondary | ICD-10-CM

## 2021-11-30 DIAGNOSIS — D120 Benign neoplasm of cecum: Secondary | ICD-10-CM | POA: Diagnosis not present

## 2021-11-30 DIAGNOSIS — E119 Type 2 diabetes mellitus without complications: Secondary | ICD-10-CM | POA: Diagnosis not present

## 2021-11-30 MED ORDER — SODIUM CHLORIDE 0.9 % IV SOLN
500.0000 mL | Freq: Once | INTRAVENOUS | Status: DC
Start: 2021-11-30 — End: 2021-11-30

## 2021-11-30 NOTE — Patient Instructions (Signed)
Handout on polyps, diverticulosis, high fiber diet and hemorrhoids provided.  Await for pathology results.  YOU HAD AN ENDOSCOPIC PROCEDURE TODAY AT [facility]:   Refer to the procedure report that was given to you for any specific questions about what was found during the examination.  If the procedure report does not answer your questions, please call your gastroenterologist to clarify.  If you requested that your care partner not be given the details of your procedure findings, then the procedure report has been included in a sealed envelope for you to review at your convenience later.  YOU SHOULD EXPECT: Some feelings of bloating in the abdomen. Passage of more gas than usual.  Walking can help get rid of the air that was put into your GI tract during the procedure and reduce the bloating. If you had a lower endoscopy (such as a colonoscopy or flexible sigmoidoscopy) you may notice spotting of blood in your stool or on the toilet paper. If you underwent a bowel prep for your procedure, you may not have a normal bowel movement for a few days.  Please Note:  You might notice some irritation and congestion in your nose or some drainage.  This is from the oxygen used during your procedure.  There is no need for concern and it should clear up in a day or so.  SYMPTOMS TO REPORT IMMEDIATELY:  Following lower endoscopy (colonoscopy or flexible sigmoidoscopy):  Excessive amounts of blood in the stool  Significant tenderness or worsening of abdominal pains  Swelling of the abdomen that is new, acute  Fever of 100F or higher  For urgent or emergent issues, a gastroenterologist can be reached at any hour by calling [phone]. Do not use MyChart messaging for urgent concerns.    DIET:  We do recommend a small meal at first, but then you may proceed to your regular diet.  Drink plenty of fluids but you should avoid alcoholic beverages for 24 hours.  ACTIVITY:  You should plan  to take it easy for the rest of today and you should NOT DRIVE or use heavy machinery until tomorrow (because of the sedation medicines used during the test).    FOLLOW UP: Our staff will call the number listed on your records 48-72 hours following your procedure to check on you and address any questions or concerns that you may have regarding the information given to you following your procedure. If we do not reach you, we will leave a message.  We will attempt to reach you two times.  During this call, we will ask if you have developed any symptoms of COVID 19. If you develop any symptoms (ie: fever, flu-like symptoms, shortness of breath, cough etc.) before then, please call [phone].  If you test positive for Covid 19 in the 2 weeks post procedure, please call and report this information to [place].    If any biopsies were taken you will be contacted by phone or by letter within the next 1-3 weeks.  Please call us at [phone] if you have not heard about the biopsies in 3 weeks.    SIGNATURES/CONFIDENTIALITY: You and/or your care partner have signed paperwork which will be entered into your electronic medical record.  These signatures attest to the fact that that the information above on your After Visit Summary has been reviewed and is understood.  Full responsibility of the confidentiality of this discharge information lies with you and/or your care-partner.

## 2021-11-30 NOTE — Progress Notes (Signed)
Called to room to assist during endoscopic procedure.  Patient ID and intended procedure confirmed with present staff. Received instructions for my participation in the procedure from the performing physician.  

## 2021-11-30 NOTE — Op Note (Signed)
[place] [facility] Patient Name: Jason Obrien Procedure Date: 11/30/2021 8:21 AM MRN: [id number] Endoscopist: Ladene Artist , MD Age: 71 Referring MD:  Date of Birth: December 10, 1950 Gender: Male Account #: [id number] Procedure:                Colonoscopy Indications:              Colon cancer screening in patient at increased                            risk: Family history of 1st-degree relative with                            colon polyps Medicines:                Monitored Anesthesia Care Procedure:                Pre-Anesthesia Assessment:                           - Prior to the procedure, a History and Physical                            was performed, and patient medications and                            allergies were reviewed. The patient's tolerance of                            previous anesthesia was also reviewed. The risks                            and benefits of the procedure and the sedation                            options and risks were discussed with the patient.                            All questions were answered, and informed consent                            was obtained. Prior Anticoagulants: The patient has                            taken no previous anticoagulant or antiplatelet                            agents. ASA Grade Assessment: II - A patient with                            mild systemic disease. After reviewing the risks                            and benefits, the patient was deemed in  satisfactory condition to undergo the procedure.                           After obtaining informed consent, the colonoscope                            was passed under direct vision. Throughout the                            procedure, the patient's blood pressure, pulse, and                            oxygen saturations were monitored continuously. The                            CF [id number] #[id number] was introduced through the anus                             and advanced to the the cecum, identified by                            appendiceal orifice and ileocecal valve. The                            ileocecal valve, appendiceal orifice, and rectum                            were photographed. The quality of the bowel                            preparation was good after extensive lavage,                            suction. The colonoscopy was performed without                            difficulty. The patient tolerated the procedure                            well. Scope In: 8:25:49 AM Scope Out: 8:44:24 AM Scope Withdrawal Time: 0 hours 16 minutes 46 seconds  Total Procedure Duration: 0 hours 18 minutes 35 seconds  Findings:                 The perianal and digital rectal examinations were                            normal.                           Six sessile polyps were found in the descending                            colon, transverse colon (3), ascending colon and  cecum. The polyps were 6 to 8 mm in size. These                            polyps were removed with a cold snare. Resection                            and retrieval were complete.                           Multiple medium-mouthed diverticula were found in                            the left colon. There was no evidence of                            diverticular bleeding.                           Internal hemorrhoids were found during                            retroflexion. The hemorrhoids were moderate and                            Grade I (internal hemorrhoids that do not prolapse).                           The exam was otherwise without abnormality on                            direct and retroflexion views. Complications:            No immediate complications. Estimated blood loss:                            None. Estimated Blood Loss:     Estimated blood loss: none. Impression:               - Six 6 to 8 mm  polyps in the descending colon, in                            the transverse colon, in the ascending colon and in                            the cecum, removed with a cold snare. Resected and                            retrieved.                           - Moderate diverticulosis in the left colon.                           - Internal hemorrhoids.                           -  The examination was otherwise normal on direct                            and retroflexion views. Recommendation:           - Repeat colonoscopy, likely 3 years, after studies                            are complete for surveillance of multiple polyps.                           - Patient has a contact number available for                            emergencies. The signs and symptoms of potential                            delayed complications were discussed with the                            patient. Return to normal activities tomorrow.                            Written discharge instructions were provided to the                            patient.                           - High fiber diet.                           - Continue present medications.                           - Await pathology results. Ladene Artist, MD 11/30/2021 8:48:23 AM This report has been signed electronically.

## 2021-11-30 NOTE — Progress Notes (Signed)
Pt's states no medical or surgical changes since previsit or office visit. 

## 2021-11-30 NOTE — Progress Notes (Signed)
History & Physical  Primary Care Physician:  Darreld Mclean, MD Primary Gastroenterologist: Lucio Edward, MD  CHIEF COMPLAINT: Family history of colon polyps  HPI: Jason Obrien is a 71 y.o. male with a first-degree relative with colon polyps and a second-degree relative with colon cancer here for colonoscopy.    Past Medical History:  Diagnosis Date   Arthritis    Cancer [facility])    Prostate Cancer for SX in Dec.2017   Cataract    FORMING BILATERAL   Chicken pox    childhood   Diabetes mellitus without complication ([place])    PREDIABETIC   History of kidney stones 03/2018   Hyperlipidemia    Kidney stones    In 2013 or 2014   Pre-diabetes     Past Surgical History:  Procedure Laterality Date   COLONOSCOPY  01/30/2003   OTHER SURGICAL HISTORY     Right arm surgery for compound fracture    QUADRICEPS TENDON REPAIR Left 05/08/2019   Procedure: REPAIR QUADRICEP TENDON;  Surgeon: Paralee Cancel, MD;  Location: [facility];  Service: Orthopedics;  Laterality: Left;  90 mins   robotic assisted lap radical prostatectomy N/A 09/14/2016   prostate cancer- done pe [facility], Dr. Nevada Crane    Prior to Admission medications   Medication Sig Start Date End Date Taking? Authorizing Provider  aspirin EC 81 MG tablet Take 81 mg by mouth daily. Swallow whole.   Yes [provider]  Glucosamine-Chondroit-Vit C-Mn (GLUCOSAMINE 1500 COMPLEX) CAPS Take by mouth.   Yes [provider]  lovastatin (MEVACOR) 40 MG tablet TAKE 1 TABLET BY MOUTH EVERY DAY 05/13/21  Yes Copland, Gay Filler, MD  metFORMIN (GLUCOPHAGE) 500 MG tablet TAKE 1 TABLET BY MOUTH EVERY DAY 05/13/21  Yes Copland, Gay Filler, MD  Multiple Vitamins-Minerals (ONE-A-DAY MENS 50+ ADVANTAGE PO) Take 1 capsule by mouth daily.   Yes [provider]  Omega-3 Fatty Acids (FISH OIL ULTRA) 1400 MG CAPS Take 1,400 mg by mouth daily.    Yes [provider]    Current Outpatient Medications  Medication Sig Dispense  Refill   aspirin EC 81 MG tablet Take 81 mg by mouth daily. Swallow whole.     Glucosamine-Chondroit-Vit C-Mn (GLUCOSAMINE 1500 COMPLEX) CAPS Take by mouth.     lovastatin (MEVACOR) 40 MG tablet TAKE 1 TABLET BY MOUTH EVERY DAY 90 tablet 3   metFORMIN (GLUCOPHAGE) 500 MG tablet TAKE 1 TABLET BY MOUTH EVERY DAY 90 tablet 3   Multiple Vitamins-Minerals (ONE-A-DAY MENS 50+ ADVANTAGE PO) Take 1 capsule by mouth daily.     Omega-3 Fatty Acids (FISH OIL ULTRA) 1400 MG CAPS Take 1,400 mg by mouth daily.      Current Facility-Administered Medications  Medication Dose Route Frequency Provider Last Rate Last Admin   0.9 %  sodium chloride infusion  500 mL Intravenous Once Ladene Artist, MD        Allergies as of 11/30/2021   (No Known Allergies)    Family History  Problem Relation Age of Onset   Hypertension Father    Heart failure Father        died age 12   Colon cancer Paternal Grandmother    Esophageal cancer Neg Hx    Rectal cancer Neg Hx    Stomach cancer Neg Hx     Social History   Socioeconomic History   Marital status: Married    Spouse name: Not on file   Number of children: Not on file  Years of education: Not on file   Highest education level: Not on file  Occupational History   Not on file  Tobacco Use   Smoking status: Never   Smokeless tobacco: Never  Vaping Use   Vaping Use: Never used  Substance and Sexual Activity   Alcohol use: Yes    Alcohol/week: 3.0 standard drinks    Types: 3 Shots of liquor per week    Comment: Social only    Drug use: No   Sexual activity: Not on file  Other Topics Concern   Not on file  Social History Narrative   Not on file   Social Determinants of Health   Financial Resource Strain: Low Risk    Difficulty of Paying Living Expenses: Not hard at all  Food Insecurity: No Food Insecurity   Worried About Charity fundraiser in the Last Year: Never true   [place] in the Last Year: Never true  Transportation Needs:  No Transportation Needs   Lack of Transportation (Medical): No   Lack of Transportation (Non-Medical): No  Physical Activity: Sufficiently Active   Days of Exercise per Week: 5 days   Minutes of Exercise per Session: 40 min  Stress: No Stress Concern Present   Feeling of Stress : Not at all  Social Connections: Socially Integrated   Frequency of Communication with Friends and Family: More than three times a week   Frequency of Social Gatherings with Friends and Family: More than three times a week   Attends Religious Services: More than 4 times per year   Active Member of [employer] or Organizations: Yes   Attends Archivist Meetings: 1 to 4 times per year   Marital Status: Married  Human resources officer Violence: Not At Risk   Fear of Current or Ex-Partner: No   Emotionally Abused: No   Physically Abused: No   Sexually Abused: No    Review of Systems:  All systems reviewed an negative except where noted in HPI.  Gen: Denies any fever, chills, sweats, anorexia, fatigue, weakness, malaise, weight loss, and sleep disorder CV: Denies chest pain, angina, palpitations, syncope, orthopnea, PND, peripheral edema, and claudication. Resp: Denies dyspnea at rest, dyspnea with exercise, cough, sputum, wheezing, coughing up blood, and pleurisy. GI: Denies vomiting blood, jaundice, and fecal incontinence.   Denies dysphagia or odynophagia. GU : Denies urinary burning, blood in urine, urinary frequency, urinary hesitancy, nocturnal urination, and urinary incontinence. MS: Denies joint pain, limitation of movement, and swelling, stiffness, low back pain, extremity pain. Denies muscle weakness, cramps, atrophy.  Derm: Denies rash, itching, dry skin, hives, moles, warts, or unhealing ulcers.  Psych: Denies depression, anxiety, memory loss, suicidal ideation, hallucinations, paranoia, and confusion. Heme: Denies bruising, bleeding, and enlarged lymph nodes. Neuro:  Denies any headaches, dizziness,  paresthesias. Endo:  Denies any problems with DM, thyroid, adrenal function.   Physical Exam: General:  Alert, well-developed, in NAD Head:  Normocephalic and atraumatic. Eyes:  Sclera clear, no icterus.   Conjunctiva pink. Ears:  Normal auditory acuity. Mouth:  No deformity or lesions.  Neck:  Supple; no masses . Lungs:  Clear throughout to auscultation.   No wheezes, crackles, or rhonchi. No acute distress. Heart:  Regular rate and rhythm; no murmurs. Abdomen:  Soft, nondistended, nontender. No masses, hepatomegaly. No obvious masses.  Normal bowel .    Rectal:  Deferred   Msk:  Symmetrical without gross deformities.. Pulses:  Normal pulses noted. Extremities:  Without edema. Neurologic:  Alert and  oriented x4;  grossly normal neurologically. Skin:  Intact without significant lesions or rashes. Cervical Nodes:  No significant cervical adenopathy. Psych:  Alert and cooperative. Normal mood and affect.   Impression / Plan:   Family history of colon polyps in a first-degree relative, family history of colon cancer in a second-degree relative for colonoscopy.  Pricilla Riffle. Fuller Plan  11/30/2021, 8:18 AM See Shea Evans, [facility], to contact our on call provider

## 2021-11-30 NOTE — Progress Notes (Signed)
Report given to PACU, vss 

## 2021-12-02 ENCOUNTER — Telehealth: Payer: Self-pay

## 2021-12-02 NOTE — Telephone Encounter (Signed)
?  Follow up Call- ? ?Call back number 11/30/2021  ?Post procedure Call Back phone  # [phone]  ?Permission to leave phone message Yes  ?Some recent data might be hidden  ?  ? ?Patient questions: ? ?Do you have a fever, pain , or abdominal swelling? No. ?Pain Score  0 * ? ?Have you tolerated food without any problems? Yes.   ? ?Have you been able to return to your normal activities? Yes.   ? ?Do you have any questions about your discharge instructions: ?Diet   No. ?Medications  No. ?Follow up visit  No. ? ?Do you have questions or concerns about your Care? No. ? ?Actions: ?* If pain score is 4 or above: ?No action needed, pain <4. ? ?Have you developed a fever since your procedure? no ? ?2.   Have you had an respiratory symptoms (SOB or cough) since your procedure? no ? ?3.   Have you tested positive for COVID 19 since your procedure no ? ?4.   Have you had any family members/close contacts diagnosed with the COVID 19 since your procedure?  no ? ? ?If yes to any of these questions please route to Joylene John, RN and Joella Prince, RN ? ? ? ?

## 2021-12-15 ENCOUNTER — Encounter: Payer: Self-pay | Admitting: Gastroenterology

## 2021-12-23 NOTE — Patient Instructions (Addendum)
It was great to see you again today!  ?Assuming all is well please see me in about 6 months ?Have a wonderful time in [place] and [place]!!   ? ?Please do keep an eye on your BP at home- if you are running higher than 140/85 on a regular basis please let me know  ?

## 2021-12-23 NOTE — Progress Notes (Signed)
Therapist, music at [facility] ?[address], [address] ?[place], [place] [address] ?336 [phone] ?Fax [phone] ? ?Date:  12/28/2021  ? ?Name:  Jason Obrien   DOB:  10-May-1951   MRN:  [id number] ? ?PCP:  Jason Mclean, MD  ? ? ?Chief Complaint: 6 month follow up (Concerns/ questions: FYI: pt had a colonoscopy with Dr Jason Obrien on 11/30/21) ? ? ?History of Present Illness: ? ?Jason Obrien is a 71 y.o. very pleasant male patient who presents with the following: ? ?Patient seen today for periodic follow-up visit ?Most recently seen by myself in September ?History of dyslipidemia, prediabetes, prostate cancer status post prostatectomy 2017, left quad tear 2020 which was repaired in August 2020 ?  ?He is retired from his work as a Dietitian ?Married to Jason Obrien, they have 2 children and 2 grandchildren who live in [place].  His grands are 6 and 69 yo ? ?Seen by his urologist, Dr. Alinda Obrien in October ?He updated colonoscopy last month-several polyps were removed ?He needs to repeat in 3 years  ? ?COVID Bivaent booster- done in November  ? ?Baby aspirin ?Lovastatin ?Metformin 500 daily ? ?Labs done in September-CMP, lipid, CBC, A1c, PSA (0)- annual PSA is ok ? ?He walks about 2.5 miles daily- gets out 4-5 x a week  ? ?His home BP has been ok- he checks on occasion ?No CP or SOB with exercise  ?BP Readings from Last 3 Encounters:  ?12/28/21 135/85  ?11/30/21 126/78  ?06/29/21 (!) 144/84  ? ?He and his wife have plans to visit [place] and [place] this year ? ?Patient Active Problem List  ? Diagnosis Date Noted  ? Left quad [place] 05/08/2019  ? Dyslipidemia 06/14/2017  ? Prostate cancer ([place]) 07/21/2016  ? Pre-diabetes 07/21/2016  ? Nephrolithiasis 07/07/2016  ? Elevated PSA 05/31/2016  ? Overweight 05/20/2016  ? ? ?Past Medical History:  ?Diagnosis Date  ? Arthritis   ? Cancer [facility])   ? Prostate Cancer for SX in Dec.2017  ? Cataract   ? FORMING BILATERAL  ? Chicken pox   ?  childhood  ? Diabetes mellitus without complication ([place])   ? PREDIABETIC  ? History of kidney stones 03/2018  ? Hyperlipidemia   ? Kidney stones   ? In 2013 or 2014  ? Pre-diabetes   ? ? ?Past Surgical History:  ?Procedure Laterality Date  ? COLONOSCOPY  01/30/2003  ? OTHER SURGICAL HISTORY    ? Right arm surgery for compound fracture   ? QUADRICEPS TENDON REPAIR Left 05/08/2019  ? Procedure: REPAIR QUADRICEP TENDON;  Surgeon: Paralee Cancel, MD;  Location: [facility];  Service: Orthopedics;  Laterality: Left;  90 mins  ? robotic assisted lap radical prostatectomy N/A 09/14/2016  ? prostate cancer- done pe [facility], Dr. [place] Crane  ? ? ?Social History  ? ?Tobacco Use  ? Smoking status: Never  ? Smokeless tobacco: Never  ?Vaping Use  ? Vaping Use: Never used  ?Substance Use Topics  ? Alcohol use: Yes  ?  Alcohol/week: 3.0 standard drinks  ?  Types: 3 Shots of liquor per week  ?  Comment: Social only   ? Drug use: No  ? ? ?Family History  ?Problem Relation Age of Onset  ? Hypertension Father   ? Heart failure Father   ?     died age 57  ? Colon cancer Paternal Grandmother   ? Esophageal cancer Neg Hx   ?  Rectal cancer Neg Hx   ? Stomach cancer Neg Hx   ? ? ?No Known Allergies ? ?Medication list has been reviewed and updated. ? ?Current Outpatient Medications on File Prior to Visit  ?Medication Sig Dispense Refill  ? aspirin EC 81 MG tablet Take 81 mg by mouth daily. Swallow whole.    ? Glucosamine-Chondroit-Vit C-Mn (GLUCOSAMINE 1500 COMPLEX) CAPS Take by mouth.    ? lovastatin (MEVACOR) 40 MG tablet TAKE 1 TABLET BY MOUTH EVERY DAY 90 tablet 3  ? metFORMIN (GLUCOPHAGE) 500 MG tablet TAKE 1 TABLET BY MOUTH EVERY DAY 90 tablet 3  ? Multiple Vitamins-Minerals (ONE-A-DAY MENS 50+ ADVANTAGE PO) Take 1 capsule by mouth daily.    ? Omega-3 Fatty Acids (FISH OIL ULTRA) 1400 MG CAPS Take 1,400 mg by mouth daily.     ? ?No current facility-administered medications on file prior to visit.  ? ? ?Review of Systems: ? ?As per HPI- otherwise  negative. ? ? ?Physical Examination: ?Vitals:  ? 12/28/21 0900 12/28/21 0917  ?BP: (!) 144/88 135/85  ?Pulse: (!) 59   ?Resp: 18   ?Temp: 98.4 ?F (36.9 ?C)   ?SpO2: 96%   ? ?Vitals:  ? 12/28/21 0900  ?Weight: 243 lb (110.2 kg)  ?Height: '5\' 9"'$  (1.753 m)  ? ?Body mass index is 35.88 kg/m?. ?Ideal Body Weight: Weight in (lb) to have BMI = 25: 168.9 ? ?GEN: no acute distress.  Obese, looks well ?HEENT: Atraumatic, Normocephalic. Bilateral TM wnl, oropharynx normal.  PEERL,EOMI.   ?Ears and Nose: No external deformity. ?CV: RRR, No M/G/R. No JVD. No thrill. No extra heart sounds. ?PULM: CTA B, no wheezes, crackles, rhonchi. No retractions. No resp. distress. No accessory muscle use. ?ABD: S, NT, ND, +BS. No rebound. No HSM. ?EXTR: No c/c/e ?PSYCH: Normally interactive. Conversant.  ? ? ?Assessment and Plan: ?Pre-diabetes - Plan: Basic metabolic panel, Hemoglobin A1c ? ?Prostate cancer ([place]) ? ?Dyslipidemia ? ?Elevated blood pressure reading ? ?Following up today for periodic health review ?Will plan further follow- up pending labs. ?Encouraged continued healthy diet and exercise routine ?Noted blood pressure is elevated today.  I have asked him to monitor at home periodically and let me know if higher than 140/80, he will do so ?In this case we will need to start medication ? ?Signed ?Lamar Blinks, MD ? ?Received labs as below, message to patient ?Results for orders placed or performed in visit on 12/28/21  ?Basic metabolic panel  ?Result Value Ref Range  ? Sodium 140 135 - 145 mEq/L  ? Potassium 4.6 3.5 - 5.1 mEq/L  ? Chloride 106 96 - 112 mEq/L  ? CO2 27 19 - 32 mEq/L  ? Glucose, Bld 107 (H) 70 - 99 mg/dL  ? BUN 28 (H) 6 - 23 mg/dL  ? Creatinine, Ser 0.86 0.40 - 1.50 mg/dL  ? GFR 86.73 >60.00 mL/min  ? Calcium 9.1 8.4 - 10.5 mg/dL  ?Hemoglobin A1c  ?Result Value Ref Range  ? Hgb A1c MFr Bld 6.2 4.6 - 6.5 %  ? ? ? ?

## 2021-12-28 ENCOUNTER — Encounter: Payer: Self-pay | Admitting: Family Medicine

## 2021-12-28 ENCOUNTER — Ambulatory Visit (INDEPENDENT_AMBULATORY_CARE_PROVIDER_SITE_OTHER): Payer: Medicare HMO | Admitting: Family Medicine

## 2021-12-28 VITALS — BP 135/85 | HR 59 | Temp 98.4°F | Resp 18 | Ht 69.0 in | Wt 243.0 lb

## 2021-12-28 DIAGNOSIS — R030 Elevated blood-pressure reading, without diagnosis of hypertension: Secondary | ICD-10-CM

## 2021-12-28 DIAGNOSIS — E785 Hyperlipidemia, unspecified: Secondary | ICD-10-CM

## 2021-12-28 DIAGNOSIS — R7303 Prediabetes: Secondary | ICD-10-CM | POA: Diagnosis not present

## 2021-12-28 DIAGNOSIS — R799 Abnormal finding of blood chemistry, unspecified: Secondary | ICD-10-CM

## 2021-12-28 DIAGNOSIS — C61 Malignant neoplasm of prostate: Secondary | ICD-10-CM | POA: Diagnosis not present

## 2021-12-28 LAB — BASIC METABOLIC PANEL
BUN: 28 mg/dL — ABNORMAL HIGH (ref 6–23)
CO2: 27 mEq/L (ref 19–32)
Calcium: 9.1 mg/dL (ref 8.4–10.5)
Chloride: 106 mEq/L (ref 96–112)
Creatinine, Ser: 0.86 mg/dL (ref 0.40–1.50)
GFR: 86.73 mL/min (ref >60.00)
Glucose, Bld: 107 mg/dL — ABNORMAL HIGH (ref 70–99)
Potassium: 4.6 mEq/L (ref 3.5–5.1)
Sodium: 140 mEq/L (ref 135–145)

## 2021-12-28 LAB — HEMOGLOBIN A1C: Hgb A1c MFr Bld: 6.2 % (ref 4.6–6.5)

## 2022-02-15 ENCOUNTER — Other Ambulatory Visit (INDEPENDENT_AMBULATORY_CARE_PROVIDER_SITE_OTHER): Payer: Medicare HMO

## 2022-02-15 DIAGNOSIS — R799 Abnormal finding of blood chemistry, unspecified: Secondary | ICD-10-CM

## 2022-02-16 ENCOUNTER — Encounter: Payer: Self-pay | Admitting: Family Medicine

## 2022-02-16 LAB — BASIC METABOLIC PANEL
BUN: 22 mg/dL (ref 6–23)
CO2: 25 mEq/L (ref 19–32)
Calcium: 9.1 mg/dL (ref 8.4–10.5)
Chloride: 102 mEq/L (ref 96–112)
Creatinine, Ser: 0.87 mg/dL (ref 0.40–1.50)
GFR: 86.34 mL/min (ref >60.00)
Glucose, Bld: 120 mg/dL — ABNORMAL HIGH (ref 70–99)
Potassium: 4.6 mEq/L (ref 3.5–5.1)
Sodium: 138 mEq/L (ref 135–145)

## 2022-04-02 ENCOUNTER — Other Ambulatory Visit: Payer: Self-pay | Admitting: Family Medicine

## 2022-04-02 DIAGNOSIS — R7303 Prediabetes: Secondary | ICD-10-CM

## 2022-04-02 DIAGNOSIS — E785 Hyperlipidemia, unspecified: Secondary | ICD-10-CM

## 2022-04-09 NOTE — Progress Notes (Unsigned)
Subjective:   Jason Obrien is a 71 y.o. male who presents for Medicare Annual/Subsequent preventive examination.  Review of Systems           Objective:    There were no vitals filed for this visit. There is no height or weight on file to calculate BMI.     04/02/2021   11:46 AM 04/01/2020    2:36 PM 05/08/2019    6:00 PM 05/07/2019   10:43 AM 08/02/2016    9:06 AM  Advanced Directives  Does Patient Have a Medical Advance Directive? No No No No No  Does patient want to make changes to medical advance directive?  No - Patient declined     Would patient like information on creating a medical advance directive? No - Patient declined  No - Patient declined No - Patient declined     Current Medications (verified) Outpatient Encounter Medications as of 04/12/2022  Medication Sig   aspirin EC 81 MG tablet Take 81 mg by mouth daily. Swallow whole.   Glucosamine-Chondroit-Vit C-Mn (GLUCOSAMINE 1500 COMPLEX) CAPS Take by mouth.   lovastatin (MEVACOR) 40 MG tablet TAKE 1 TABLET BY MOUTH EVERY DAY   metFORMIN (GLUCOPHAGE) 500 MG tablet TAKE 1 TABLET BY MOUTH EVERY DAY   Multiple Vitamins-Minerals (ONE-A-DAY MENS 50+ ADVANTAGE PO) Take 1 capsule by mouth daily.   Omega-3 Fatty Acids (FISH OIL ULTRA) 1400 MG CAPS Take 1,400 mg by mouth daily.    No facility-administered encounter medications on file as of 04/12/2022.    Allergies (verified) Patient has no known allergies.   History: Past Medical History:  Diagnosis Date   Arthritis    Cancer ([place])    Prostate Cancer for SX in Dec.2017   Cataract    FORMING BILATERAL   Chicken pox    childhood   Diabetes mellitus without complication ([place])    PREDIABETIC   History of kidney stones 03/2018   Hyperlipidemia    Kidney stones    In 2013 or 2014   Pre-diabetes    Past Surgical History:  Procedure Laterality Date   COLONOSCOPY  01/30/2003   OTHER SURGICAL HISTORY     Right arm surgery for compound fracture    QUADRICEPS  TENDON REPAIR Left 05/08/2019   Procedure: REPAIR QUADRICEP TENDON;  Surgeon: Paralee Cancel, MD;  Location: [facility];  Service: Orthopedics;  Laterality: Left;  90 mins   robotic assisted lap radical prostatectomy N/A 09/14/2016   prostate cancer- done pe [facility], Dr. Nevada Crane   Family History  Problem Relation Age of Onset   Hypertension Father    Heart failure Father        died age 64   Colon cancer Paternal Grandmother    Esophageal cancer Neg Hx    Rectal cancer Neg Hx    Stomach cancer Neg Hx    Social History   Socioeconomic History   Marital status: Married    Spouse name: Not on file   Number of children: Not on file   Years of education: Not on file   Highest education level: Not on file  Occupational History   Not on file  Tobacco Use   Smoking status: Never   Smokeless tobacco: Never  Vaping Use   Vaping Use: Never used  Substance and Sexual Activity   Alcohol use: Yes    Alcohol/week: 3.0 standard drinks of alcohol    Types: 3 Shots of liquor per week    Comment: Social only  Drug use: No   Sexual activity: Not on file  Other Topics Concern   Not on file  Social History Narrative   Not on file   Social Determinants of Health   Financial Resource Strain: Low Risk  (04/02/2021)   Overall Financial Resource Strain (CARDIA)    Difficulty of Paying Living Expenses: Not hard at all  Food Insecurity: No Food Insecurity (04/02/2021)   Hunger Vital Sign    Worried About Running Out of Food in the Last Year: Never true    Ran Out of Food in the Last Year: Never true  Transportation Needs: No Transportation Needs (04/02/2021)   PRAPARE - Hydrologist (Medical): No    Lack of Transportation (Non-Medical): No  Physical Activity: Sufficiently Active (04/02/2021)   Exercise Vital Sign    Days of Exercise per Week: 5 days    Minutes of Exercise per Session: 40 min  Stress: No Stress Concern Present (04/02/2021)   [place]    Feeling of Stress : Not at all  Social Connections: [place] (04/02/2021)   Social Connection and Isolation Panel [NHANES]    Frequency of Communication with Friends and Family: More than three times a week    Frequency of Social Gatherings with Friends and Family: More than three times a week    Attends Religious Services: More than 4 times per year    Active Member of [employer] or Organizations: Yes    Attends Archivist Meetings: 1 to 4 times per year    Marital Status: Married    Tobacco Counseling Counseling given: Not Answered   Clinical Intake:                 Diabetic?no         Activities of Daily Living     No data to display          Patient Care Team: Copland, Gay Filler, MD as PCP - General (Family Medicine)  Indicate any recent Medical Services you may have received from other than [facility] providers in the past year (date may be approximate).     Assessment:   This is a routine wellness examination for Jason Obrien.  Hearing/Vision screen No results found.  Dietary issues and exercise activities discussed:     Goals Addressed   None    Depression Screen    04/02/2021   11:50 AM 04/01/2020    2:45 PM 06/13/2017    8:24 AM 05/20/2016    8:32 AM  PHQ 2/9 Scores  PHQ - 2 Score 0 0 0 0    Fall Risk    04/02/2021   11:48 AM 04/01/2020    2:45 PM 06/13/2017    8:24 AM 05/20/2016    8:32 AM  Fall Risk   Falls in the past year? 0 1 No No  Number falls in past yr: 0 0    Injury with Fall? 0 1    Follow up Falls prevention discussed Education provided;Falls prevention discussed      FALL RISK PREVENTION PERTAINING TO THE HOME:  Any stairs in or around the home? {YES/NO:21197} If so, are there any without handrails? {YES/NO:21197} Home free of loose throw rugs in walkways, pet beds, electrical cords, etc? {YES/NO:21197} Adequate lighting in your home to reduce risk of falls?  {YES/NO:21197}  ASSISTIVE DEVICES UTILIZED TO PREVENT FALLS:  Life alert? {YES/NO:21197} Use of a cane, walker or  w/c? {YES/NO:21197} Grab bars in the bathroom? {YES/NO:21197} Shower chair or bench in shower? {YES/NO:21197} Elevated toilet seat or a handicapped toilet? {YES/NO:21197}  TIMED UP AND GO:  Was the test performed? {YES/NO:21197}.  Length of time to ambulate 10 feet: *** sec.   {Appearance of YBWL:8937342}  Cognitive Function:        Immunizations Immunization History  Administered Date(s) Administered   Fluad Quad(high Dose 65+) 07/04/2019, 06/29/2021   Influenza, High Dose Seasonal PF 07/21/2016, 06/13/2017, 09/11/2018   Influenza-Unspecified 08/04/2020   Moderna Sars-Covid-2 Vaccination 11/03/2019, 12/03/2019, 08/24/2020, 08/17/2021   Pneumococcal Conjugate-13 05/20/2016   Pneumococcal Polysaccharide-23 06/13/2017   Tdap 10/04/2014   Zoster Recombinat (Shingrix) 01/19/2019, 04/09/2019    TDAP status: Up to date  Flu Vaccine status: Up to date  Pneumococcal vaccine status: Up to date  {Covid-19 vaccine status:2101808}  Qualifies for Shingles Vaccine? Yes   Zostavax completed No   Shingrix Completed?: Yes  Screening Tests Health Maintenance  Topic Date Due   URINE MICROALBUMIN  Never done   COVID-19 Vaccine (5 - Booster for Moderna series) 10/12/2021   INFLUENZA VACCINE  05/04/2022   TETANUS/TDAP  10/04/2024   COLONOSCOPY (Pts 45-51yr Insurance coverage will need to be confirmed)  11/30/2024   Pneumonia Vaccine 72 Years old  Completed   Hepatitis C Screening  Completed   Zoster Vaccines- Shingrix  Completed   HPV VACCINES  Aged Out    Health Maintenance  Health Maintenance Due  Topic Date Due   URINE MICROALBUMIN  Never done   COVID-19 Vaccine (5 - Booster for Moderna series) 10/12/2021    Colorectal cancer screening: Type of screening: Colonoscopy. Completed 11/30/21. Repeat every 3 years  Lung Cancer Screening: (Low Dose CT Chest  recommended if Age 72-80years, 30 pack-year currently smoking OR have quit w/in 15years.) does not qualify.   Lung Cancer Screening Referral: n/a  Additional Screening:  Hepatitis C Screening: does qualify; Completed 05/20/16  Vision Screening: Recommended annual ophthalmology exams for early detection of glaucoma and other disorders of the eye. Is the patient up to date with their annual eye exam?  {YES/NO:21197} Who is the provider or what is the name of the office in which the patient attends annual eye exams? *** If pt is not established with a provider, would they like to be referred to a provider to establish care? {YES/NO:21197}.   Dental Screening: Recommended annual dental exams for proper oral hygiene  Community Resource Referral / Chronic Care Management: CRR required this visit?  {YES/NO:21197}  CCM required this visit?  {YES/NO:21197}     Plan:     I have personally reviewed and noted the following in the patient's chart:   Medical and social history Use of alcohol, tobacco or illicit drugs  Current medications and supplements including opioid prescriptions. {Opioid Prescriptions:[phone]} Functional ability and status Nutritional status Physical activity Advanced directives List of other physicians Hospitalizations, surgeries, and ER visits in previous 12 months Vitals Screenings to include cognitive, depression, and falls Referrals and appointments  In addition, I have reviewed and discussed with patient certain preventive protocols, quality metrics, and best practice recommendations. A written personalized care plan for preventive services as well as general preventive health recommendations were provided to patient.     SDuard BradyChism, CMA   04/09/2022   Nurse Notes: ***

## 2022-04-12 ENCOUNTER — Ambulatory Visit (INDEPENDENT_AMBULATORY_CARE_PROVIDER_SITE_OTHER): Payer: Medicare HMO

## 2022-04-12 ENCOUNTER — Ambulatory Visit: Payer: Medicare HMO

## 2022-04-12 DIAGNOSIS — Z0000 Encounter for general adult medical examination without abnormal findings: Secondary | ICD-10-CM

## 2022-04-12 NOTE — Patient Instructions (Signed)
Jason Obrien , Thank you for taking time to come for your Medicare Wellness Visit. I appreciate your ongoing commitment to your health goals. Please review the following plan we discussed and let me know if I can assist you in the future.   Screening recommendations/referrals: Colonoscopy: 11/20/21 due 11/20/24 Recommended yearly ophthalmology/optometry visit for glaucoma screening and checkup Recommended yearly dental visit for hygiene and checkup  Vaccinations: Influenza vaccine: up to date Pneumococcal vaccine: up to date Tdap vaccine: up to date Shingles vaccine: up to date   Covid-19: Due-May obtain vaccine at your local pharmacy.   Advanced directives: no   Conditions/risks identified: see problem list   Next appointment: Follow up in one year for your annual wellness visit.    Preventive Care 74 Years and Older, Male Preventive care refers to lifestyle choices and visits with your health care provider that can promote health and wellness. What does preventive care include? A yearly physical exam. This is also called an annual well check. Dental exams once or twice a year. Routine eye exams. Ask your health care provider how often you should have your eyes checked. Personal lifestyle choices, including: Daily care of your teeth and gums. Regular physical activity. Eating a healthy diet. Avoiding tobacco and drug use. Limiting alcohol use. Practicing safe sex. Taking low doses of aspirin every day. Taking vitamin and mineral supplements as recommended by your health care provider. What happens during an annual well check? The services and screenings done by your health care provider during your annual well check will depend on your age, overall health, lifestyle risk factors, and family history of disease. Counseling  Your health care provider may ask you questions about your: Alcohol use. Tobacco use. Drug use. Emotional well-being. Home and relationship  well-being. Sexual activity. Eating habits. History of falls. Memory and ability to understand (cognition). Work and work Statistician. Screening  You may have the following tests or measurements: Height, weight, and BMI. Blood pressure. Lipid and cholesterol levels. These may be checked every 5 years, or more frequently if you are over 66 years old. Skin check. Lung cancer screening. You may have this screening every year starting at age 80 if you have a 30-pack-year history of smoking and currently smoke or have quit within the past 15 years. Fecal occult blood test (FOBT) of the stool. You may have this test every year starting at age 73. Flexible sigmoidoscopy or colonoscopy. You may have a sigmoidoscopy every 5 years or a colonoscopy every 10 years starting at age 53. Prostate cancer screening. Recommendations will vary depending on your family history and other risks. Hepatitis C blood test. Hepatitis B blood test. Sexually transmitted disease (STD) testing. Diabetes screening. This is done by checking your blood sugar (glucose) after you have not eaten for a while (fasting). You may have this done every 1-3 years. Abdominal aortic aneurysm (AAA) screening. You may need this if you are a current or former smoker. Osteoporosis. You may be screened starting at age 8 if you are at high risk. Talk with your health care provider about your test results, treatment options, and if necessary, the need for more tests. Vaccines  Your health care provider may recommend certain vaccines, such as: Influenza vaccine. This is recommended every year. Tetanus, diphtheria, and acellular pertussis (Tdap, Td) vaccine. You may need a Td booster every 10 years. Zoster vaccine. You may need this after age 64. Pneumococcal 13-valent conjugate (PCV13) vaccine. One dose is recommended after age 53. Pneumococcal  polysaccharide (PPSV23) vaccine. One dose is recommended after age 34. Talk to your health care  provider about which screenings and vaccines you need and how often you need them. This information is not intended to replace advice given to you by your health care provider. Make sure you discuss any questions you have with your health care provider. Document Released: 10/17/2015 Document Revised: 06/09/2016 Document Reviewed: 07/22/2015 Elsevier Interactive Patient Education  2017 [place] Prevention in the Home Falls can cause injuries. They can happen to people of all ages. There are many things you can do to make your home safe and to help prevent falls. What can I do on the outside of my home? Regularly fix the edges of walkways and driveways and fix any cracks. Remove anything that might make you trip as you walk through a door, such as a raised step or threshold. Trim any bushes or trees on the path to your home. Use bright outdoor lighting. Clear any walking paths of anything that might make someone trip, such as rocks or tools. Regularly check to see if handrails are loose or broken. Make sure that both sides of any steps have handrails. Any raised decks and porches should have guardrails on the edges. Have any leaves, snow, or ice cleared regularly. Use sand or salt on walking paths during winter. Clean up any spills in your garage right away. This includes oil or grease spills. What can I do in the bathroom? Use night lights. Install grab bars by the toilet and in the tub and shower. Do not use towel bars as grab bars. Use non-skid mats or decals in the tub or shower. If you need to sit down in the shower, use a plastic, non-slip stool. Keep the floor dry. Clean up any water that spills on the floor as soon as it happens. Remove soap buildup in the tub or shower regularly. Attach bath mats securely with double-sided non-slip rug tape. Do not have throw rugs and other things on the floor that can make you trip. What can I do in the bedroom? Use night lights. Make  sure that you have a light by your bed that is easy to reach. Do not use any sheets or blankets that are too big for your bed. They should not hang down onto the floor. Have a firm chair that has side arms. You can use this for support while you get dressed. Do not have throw rugs and other things on the floor that can make you trip. What can I do in the kitchen? Clean up any spills right away. Avoid walking on wet floors. Keep items that you use a lot in easy-to-reach places. If you need to reach something above you, use a strong step stool that has a grab bar. Keep electrical cords out of the way. Do not use floor polish or wax that makes floors slippery. If you must use wax, use non-skid floor wax. Do not have throw rugs and other things on the floor that can make you trip. What can I do with my stairs? Do not leave any items on the stairs. Make sure that there are handrails on both sides of the stairs and use them. Fix handrails that are broken or loose. Make sure that handrails are as long as the stairways. Check any carpeting to make sure that it is firmly attached to the stairs. Fix any carpet that is loose or worn. Avoid having throw rugs at the top  or bottom of the stairs. If you do have throw rugs, attach them to the floor with carpet tape. Make sure that you have a light switch at the top of the stairs and the bottom of the stairs. If you do not have them, ask someone to add them for you. What else can I do to help prevent falls? Wear shoes that: Do not have high heels. Have rubber bottoms. Are comfortable and fit you well. Are closed at the toe. Do not wear sandals. If you use a stepladder: Make sure that it is fully opened. Do not climb a closed stepladder. Make sure that both sides of the stepladder are locked into place. Ask someone to hold it for you, if possible. Clearly mark and make sure that you can see: Any grab bars or handrails. First and last steps. Where the  edge of each step is. Use tools that help you move around (mobility aids) if they are needed. These include: Canes. Walkers. Scooters. Crutches. Turn on the lights when you go into a dark area. Replace any light bulbs as soon as they burn out. Set up your furniture so you have a clear path. Avoid moving your furniture around. If any of your floors are uneven, fix them. If there are any pets around you, be aware of where they are. Review your medicines with your doctor. Some medicines can make you feel dizzy. This can increase your chance of falling. Ask your doctor what other things that you can do to help prevent falls. This information is not intended to replace advice given to you by your health care provider. Make sure you discuss any questions you have with your health care provider. Document Released: 07/17/2009 Document Revised: 02/26/2016 Document Reviewed: 10/25/2014 Elsevier Interactive Patient Education  2017 [employer].

## 2022-04-13 ENCOUNTER — Ambulatory Visit: Payer: Medicare HMO

## 2022-06-27 NOTE — Patient Instructions (Incomplete)
It was great to see you again today I will be in touch with your lab work, recommend COVID booster, seasonal flu shot, RSV if you like

## 2022-06-27 NOTE — Progress Notes (Addendum)
[place] at [facility] [place], [place], [place] [address] [phone]  Date:  06/30/2022   Name:  Jason Obrien   DOB:  [birth year]   MRN:  [id number]  PCP:  Darreld Mclean, MD    Chief Complaint: 6 month follow up (Concerns/ questions: covid booster, would like PSA drawn today with labs/Flu shot today: yes/)   History of Present Illness:  Jason Obrien is a 71 y.o. very pleasant male patient who presents with the following:  Patient seen today for periodic follow-up Most recent visit with myself was in March of this year  History of dyslipidemia, prediabetes, prostate cancer status post prostatectomy 2017, left quad tear 2020 which was repaired in August 2020   He is retired from his work as a Dietitian Married to [place], they have 2 children and 2 grandchildren who live in [place].  His grandchildren are now 4 and 27 years old- everyone is doing well  He has been traveling- they went to [place] over the summer  He follows up with his urologist, Dr. Alinda Money- however I did check his PSA last year for him.  He would like to do this again- seeing Dr Alinda Money in a couple of weeks   Recommend latest COVID booster, flu shot Flu shot today He got a covid shot in July just prior to their [place] trip  Shingrix, pneumonia is up-to-date Can offer coronary calcium Colonoscopy done this past February  Most recent lab work on chart from May, BMP only Most recent PSA on chart from 1 year ago, 0.0  Aspirin 81 Lovastatin Metformin Omega-3  Wt Readings from Last 3 Encounters:  06/30/22 245 lb 12.8 oz (111.5 kg)  12/28/21 243 lb (110.2 kg)  11/30/21 230 lb (104.3 kg)   Pt would like to get to down to 225 lbs  he gained some weight on recent travels!  BP Readings from Last 3 Encounters:  06/30/22 130/80  12/28/21 135/85  11/30/21 126/78     Lab Results  Component Value Date   PSA 0.00 (L)  06/30/2022   PSA 0.00 Repeated and verified X2. (L) 06/29/2021   PSA 6.29 (H) 05/20/2016     Patient Active Problem List   Diagnosis Date Noted   Left quad rutpure 05/08/2019   Dyslipidemia 06/14/2017   Prostate cancer ([place]) 07/21/2016   Pre-diabetes 07/21/2016   Nephrolithiasis 07/07/2016   Elevated PSA 05/31/2016   Overweight 05/20/2016    Past Medical History:  Diagnosis Date   Arthritis    Cancer [facility])    Prostate Cancer for SX in Dec.2017   Cataract    FORMING BILATERAL   Chicken pox    childhood   Diabetes mellitus without complication ([place])    PREDIABETIC   History of kidney stones 03/2018   Hyperlipidemia    Kidney stones    In 2013 or 2014   Pre-diabetes     Past Surgical History:  Procedure Laterality Date   COLONOSCOPY  01/30/2003   OTHER SURGICAL HISTORY     Right arm surgery for compound fracture    QUADRICEPS TENDON REPAIR Left 05/08/2019   Procedure: REPAIR QUADRICEP TENDON;  Surgeon: Paralee Cancel, MD;  Location: [facility];  Service: Orthopedics;  Laterality: Left;  90 mins   robotic assisted lap radical prostatectomy N/A 09/14/2016   prostate cancer- done pe [facility], Dr. Nevada Crane    Social History   Tobacco Use  Smoking status: Never   Smokeless tobacco: Never  Vaping Use   Vaping Use: Never used  Substance Use Topics   Alcohol use: Yes    Alcohol/week: 3.0 standard drinks of alcohol    Types: 3 Shots of liquor per week    Comment: Social only    Drug use: No    Family History  Problem Relation Age of Onset   Hypertension Father    Heart failure Father        died age 68   Colon cancer Paternal Grandmother    Esophageal cancer Neg Hx    Rectal cancer Neg Hx    Stomach cancer Neg Hx     No Known Allergies  Medication list has been reviewed and updated.  Current Outpatient Medications on File Prior to Visit  Medication Sig Dispense Refill   aspirin EC 81 MG tablet Take 81 mg by mouth daily. Swallow whole.     Glucosamine-Chondroit-Vit  C-Mn (GLUCOSAMINE 1500 COMPLEX) CAPS Take by mouth.     lovastatin (MEVACOR) 40 MG tablet TAKE 1 TABLET BY MOUTH EVERY DAY 90 tablet 3   Multiple Vitamins-Minerals (ONE-A-DAY MENS 50+ ADVANTAGE PO) Take 1 capsule by mouth daily.     Omega-3 Fatty Acids (FISH OIL ULTRA) 1400 MG CAPS Take 1,400 mg by mouth daily.      No current facility-administered medications on file prior to visit.    Review of Systems:  As per HPI- otherwise negative.   Physical Examination: Vitals:   06/30/22 0846  BP: 130/80  Pulse: 67  Resp: 18  Temp: 97.6 F (36.4 C)  SpO2: 95%   Vitals:   06/30/22 0846  Weight: 245 lb 12.8 oz (111.5 kg)  Height: '5\' 9"'$  (1.753 m)   Body mass index is 36.3 kg/m. Ideal Body Weight: Weight in (lb) to have BMI = 25: 168.9  GEN: no acute distress. Obese, looks well  HEENT: Atraumatic, Normocephalic.  Ears and Nose: No external deformity. CV: RRR, No M/G/R. No JVD. No thrill. No extra heart sounds. PULM: CTA B, no wheezes, crackles, rhonchi. No retractions. No resp. distress. No accessory muscle use. ABD: S, NT, ND, +BS. No rebound. No HSM. EXTR: No c/c/e PSYCH: Normally interactive. Conversant.   EKG: normal, rate 59 Assessment and Plan: Dyslipidemia - Plan: Lipid panel, CT CARDIAC SCORING (SELF PAY ONLY), EKG 12-Lead  Elevated blood pressure reading - Plan: CBC, Comprehensive metabolic panel, EKG 02-RKYH  Pre-diabetes - Plan: Comprehensive metabolic panel, Hemoglobin A1c, metFORMIN (GLUCOPHAGE) 500 MG tablet, CT CARDIAC SCORING (SELF PAY ONLY), EKG 12-Lead  Prostate cancer (HCC) - Plan: PSA  Screening for deficiency anemia - Plan: CBC  Need for influenza vaccination - Plan: Flu Vaccine QUAD 6+ mos PF IM (Fluarix Quad PF)   Patient seen today for follow-up.  We will get a baseline EKG for history of hypertension Labs are pending as above, he will see his urologist in a couple of weeks and would like PSA done today Flu shot We discussed getting a coronary  calcium and he is interested.  Order this for him today Will plan further follow- up pending labs. Assuming all is well, plan to recheck in about 6 months Signed Lamar Blinks, MD  Received labs as below, message to patient Results for orders placed or performed in visit on 06/30/22  CBC  Result Value Ref Range   WBC 4.5 4.0 - 10.5 K/uL   RBC 4.21 (L) 4.22 - 5.81 Mil/uL   Platelets 247.0 150.0 -  400.0 K/uL   Hemoglobin 13.5 13.0 - 17.0 g/dL   HCT 40.9 39.0 - 52.0 %   MCV 97.2 78.0 - 100.0 fl   MCHC 33.0 30.0 - 36.0 g/dL   RDW 15.1 11.5 - 15.5 %  Comprehensive metabolic panel  Result Value Ref Range   Sodium 138 135 - 145 mEq/L   Potassium 4.7 3.5 - 5.1 mEq/L   Chloride 103 96 - 112 mEq/L   CO2 29 19 - 32 mEq/L   Glucose, Bld 124 (H) 70 - 99 mg/dL   BUN 24 (H) 6 - 23 mg/dL   Creatinine, Ser 0.92 0.40 - 1.50 mg/dL   Total Bilirubin 0.9 0.2 - 1.2 mg/dL   Alkaline Phosphatase 57 39 - 117 U/L   AST 23 0 - 37 U/L   ALT 24 0 - 53 U/L   Total Protein 6.9 6.0 - 8.3 g/dL   Albumin 4.1 3.5 - 5.2 g/dL   GFR 83.03 >60.00 mL/min   Calcium 9.2 8.4 - 10.5 mg/dL  Hemoglobin A1c  Result Value Ref Range   Hgb A1c MFr Bld 6.4 4.6 - 6.5 %  Lipid panel  Result Value Ref Range   Cholesterol 170 0 - 200 mg/dL   Triglycerides 48.0 0.0 - 149.0 mg/dL   HDL 68.30 >39.00 mg/dL   VLDL 9.6 0.0 - 40.0 mg/dL   LDL Cholesterol 92 0 - 99 mg/dL   Total CHOL/HDL Ratio 2    NonHDL 101.39   PSA  Result Value Ref Range   PSA 0.00 (L) 0.10 - 4.00 ng/mL

## 2022-06-30 ENCOUNTER — Encounter: Payer: Self-pay | Admitting: Family Medicine

## 2022-06-30 ENCOUNTER — Ambulatory Visit (INDEPENDENT_AMBULATORY_CARE_PROVIDER_SITE_OTHER): Payer: Medicare HMO | Admitting: Family Medicine

## 2022-06-30 VITALS — BP 130/80 | HR 67 | Temp 97.6°F | Resp 18 | Ht 69.0 in | Wt 245.8 lb

## 2022-06-30 DIAGNOSIS — E785 Hyperlipidemia, unspecified: Secondary | ICD-10-CM | POA: Diagnosis not present

## 2022-06-30 DIAGNOSIS — Z23 Encounter for immunization: Secondary | ICD-10-CM | POA: Diagnosis not present

## 2022-06-30 DIAGNOSIS — C61 Malignant neoplasm of prostate: Secondary | ICD-10-CM | POA: Diagnosis not present

## 2022-06-30 DIAGNOSIS — Z130 Encounter for screening for diseases of the blood and blood-forming organs and certain disorders involving the immune mechanism: Secondary | ICD-10-CM | POA: Diagnosis not present

## 2022-06-30 DIAGNOSIS — R7303 Prediabetes: Secondary | ICD-10-CM | POA: Diagnosis not present

## 2022-06-30 DIAGNOSIS — I77810 Thoracic aortic ectasia: Secondary | ICD-10-CM | POA: Diagnosis not present

## 2022-06-30 DIAGNOSIS — R030 Elevated blood-pressure reading, without diagnosis of hypertension: Secondary | ICD-10-CM | POA: Diagnosis not present

## 2022-06-30 LAB — CBC
HCT: 40.9 % (ref 39.0–52.0)
Hemoglobin: 13.5 g/dL (ref 13.0–17.0)
MCHC: 33 g/dL (ref 30.0–36.0)
MCV: 97.2 fl (ref 78.0–100.0)
Platelets: 247 10*3/uL (ref 150.0–400.0)
RBC: 4.21 Mil/uL — ABNORMAL LOW (ref 4.22–5.81)
RDW: 15.1 % (ref 11.5–15.5)
WBC: 4.5 10*3/uL (ref 4.0–10.5)

## 2022-06-30 LAB — HEMOGLOBIN A1C: Hgb A1c MFr Bld: 6.4 % (ref 4.6–6.5)

## 2022-06-30 LAB — LIPID PANEL
Cholesterol: 170 mg/dL (ref 0–200)
HDL: 68.3 mg/dL (ref >39.00)
LDL Cholesterol: 92 mg/dL (ref 0–99)
NonHDL: 101.39
Total CHOL/HDL Ratio: 2
Triglycerides: 48 mg/dL (ref 0.0–149.0)
VLDL: 9.6 mg/dL (ref 0.0–40.0)

## 2022-06-30 LAB — COMPREHENSIVE METABOLIC PANEL
ALT: 24 U/L (ref 0–53)
AST: 23 U/L (ref 0–37)
Albumin: 4.1 g/dL (ref 3.5–5.2)
Alkaline Phosphatase: 57 U/L (ref 39–117)
BUN: 24 mg/dL — ABNORMAL HIGH (ref 6–23)
CO2: 29 mEq/L (ref 19–32)
Calcium: 9.2 mg/dL (ref 8.4–10.5)
Chloride: 103 mEq/L (ref 96–112)
Creatinine, Ser: 0.92 mg/dL (ref 0.40–1.50)
GFR: 83.03 mL/min (ref >60.00)
Glucose, Bld: 124 mg/dL — ABNORMAL HIGH (ref 70–99)
Potassium: 4.7 mEq/L (ref 3.5–5.1)
Sodium: 138 mEq/L (ref 135–145)
Total Bilirubin: 0.9 mg/dL (ref 0.2–1.2)
Total Protein: 6.9 g/dL (ref 6.0–8.3)

## 2022-06-30 LAB — PSA: PSA: 0 ng/mL — ABNORMAL LOW (ref 0.10–4.00)

## 2022-06-30 MED ORDER — METFORMIN HCL 500 MG PO TABS: 500.0000 mg | ORAL_TABLET | Freq: Every day | ORAL | 3 refills | Status: DC

## 2022-07-02 ENCOUNTER — Ambulatory Visit (HOSPITAL_BASED_OUTPATIENT_CLINIC_OR_DEPARTMENT_OTHER)
Admission: RE | Admit: 2022-07-02 | Discharge: 2022-07-02 | Disposition: A | Discharge status: Home / Self Care | Payer: Medicare HMO | Source: Ambulatory Visit | Attending: Family Medicine | Admitting: Family Medicine

## 2022-07-02 DIAGNOSIS — R7303 Prediabetes: Secondary | ICD-10-CM | POA: Insufficient documentation

## 2022-07-02 DIAGNOSIS — E785 Hyperlipidemia, unspecified: Secondary | ICD-10-CM | POA: Insufficient documentation

## 2022-07-05 ENCOUNTER — Encounter: Payer: Self-pay | Admitting: Family Medicine

## 2022-07-05 DIAGNOSIS — R030 Elevated blood-pressure reading, without diagnosis of hypertension: Secondary | ICD-10-CM

## 2022-07-05 DIAGNOSIS — E785 Hyperlipidemia, unspecified: Secondary | ICD-10-CM

## 2022-07-05 DIAGNOSIS — I77810 Thoracic aortic ectasia: Secondary | ICD-10-CM | POA: Insufficient documentation

## 2022-07-05 MED ORDER — LISINOPRIL 10 MG PO TABS: 10.0000 mg | ORAL_TABLET | Freq: Every day | ORAL | 3 refills | Status: DC

## 2022-07-05 MED ORDER — DOXYCYCLINE HYCLATE 100 MG PO CAPS: 100.0000 mg | ORAL_CAPSULE | Freq: Two times a day (BID) | ORAL | 0 refills | Status: DC

## 2022-07-05 MED ORDER — ROSUVASTATIN CALCIUM 20 MG PO TABS: 20.0000 mg | ORAL_TABLET | Freq: Every day | ORAL | 3 refills | Status: DC

## 2022-07-07 DIAGNOSIS — Z87442 Personal history of urinary calculi: Secondary | ICD-10-CM | POA: Diagnosis not present

## 2022-07-07 DIAGNOSIS — Z8546 Personal history of malignant neoplasm of prostate: Secondary | ICD-10-CM | POA: Diagnosis not present

## 2022-12-19 NOTE — Progress Notes (Unsigned)
[place] at [facility] [address] [place], [place] [address] [phone] [id number] [phone]  Date:  12/23/2022   Name:  Jason Obrien   DOB:  1950-12-18   MRN:  [id number]  PCP:  Darreld Mclean, MD    Chief Complaint: No chief complaint on file.   History of Present Illness:  Jason Obrien is a 72 y.o. very pleasant male patient who presents with the following:  Pt seen today for periodic follow-up Last visit with myself 9/23-  History of dyslipidemia, prediabetes, prostate cancer status post prostatectomy 2017, left quad tear 2020 which was repaired in August 2020   He is retired from his work as a Dietitian Married to [place], they have 2 children and 2 grandchildren who live in [place].   He sees Dr Alinda Money with urology for prostate ca surveillance  Immun UTI- most recent covid booster:   Labs done in September: CMP, lipid, CBC, A1c 6.4%, PSA 0 Coronary calcium done last year- around 50th%- we changed from lovastatin to crestor    Patient Active Problem List   Diagnosis Date Noted   Ascending aorta dilatation ([place]) 07/05/2022   Left quad rutpure 05/08/2019   Dyslipidemia 06/14/2017   Prostate cancer ([place]) 07/21/2016   Pre-diabetes 07/21/2016   Nephrolithiasis 07/07/2016   Elevated PSA 05/31/2016   Overweight 05/20/2016    Past Medical History:  Diagnosis Date   Arthritis    Cancer [facility])    Prostate Cancer for SX in Dec.2017   Cataract    FORMING BILATERAL   Chicken pox    childhood   Diabetes mellitus without complication ([place])    PREDIABETIC   History of kidney stones 03/2018   Hyperlipidemia    Kidney stones    In 2013 or 2014   Pre-diabetes     Past Surgical History:  Procedure Laterality Date   COLONOSCOPY  01/30/2003   OTHER SURGICAL HISTORY     Right arm surgery for compound fracture    QUADRICEPS TENDON REPAIR Left 05/08/2019   Procedure: REPAIR QUADRICEP TENDON;  Surgeon:  Paralee Cancel, MD;  [facility];  Service: Orthopedics;  Laterality: Left;  90 mins   robotic assisted lap radical prostatectomy N/A 09/14/2016   prostate cancer- done pe [facility], Dr. Nevada Crane    Social History   Tobacco Use   Smoking status: Never   Smokeless tobacco: Never  Vaping Use   Vaping Use: Never used  Substance Use Topics   Alcohol use: Yes    Alcohol/week: 3.0 standard drinks of alcohol    Types: 3 Shots of liquor per week    Comment: Social only    Drug use: No    Family History  Problem Relation Age of Onset   Hypertension Father    Heart failure Father        died age 24   Colon cancer Paternal Grandmother    Esophageal cancer Neg Hx    Rectal cancer Neg Hx    Stomach cancer Neg Hx     No Known Allergies  Medication list has been reviewed and updated.  Current Outpatient Medications on File Prior to Visit  Medication Sig Dispense Refill   aspirin EC 81 MG tablet Take 81 mg by mouth daily. Swallow whole.     doxycycline (VIBRAMYCIN) 100 MG capsule Take 1 capsule (100 mg total) by mouth 2 (two) times daily. 20 capsule 0   Glucosamine-Chondroit-Vit C-Mn (  GLUCOSAMINE 1500 COMPLEX) CAPS Take by mouth.     lisinopril (ZESTRIL) 10 MG tablet Take 1 tablet (10 mg total) by mouth daily. 90 tablet 3   metFORMIN (GLUCOPHAGE) 500 MG tablet Take 1 tablet (500 mg total) by mouth daily. 90 tablet 3   Multiple Vitamins-Minerals (ONE-A-DAY MENS 50+ ADVANTAGE PO) Take 1 capsule by mouth daily.     Omega-3 Fatty Acids (FISH OIL ULTRA) 1400 MG CAPS Take 1,400 mg by mouth daily.      rosuvastatin (CRESTOR) 20 MG tablet Take 1 tablet (20 mg total) by mouth daily. 90 tablet 3   No current facility-administered medications on file prior to visit.    Review of Systems:  As per HPI- otherwise negative.   Physical Examination: There were no vitals filed for this visit. There were no vitals filed for this visit. There is no height or weight on file to calculate BMI. Ideal  Body Weight:    GEN: no acute distress. HEENT: Atraumatic, Normocephalic.  Ears and Nose: No external deformity. CV: RRR, No M/G/R. No JVD. No thrill. No extra heart sounds. PULM: CTA B, no wheezes, crackles, rhonchi. No retractions. No resp. distress. No accessory muscle use. ABD: S, NT, ND, +BS. No rebound. No HSM. EXTR: No c/c/e PSYCH: Normally interactive. Conversant.    Assessment and Plan: ***  Signed Lamar Blinks, MD

## 2022-12-19 NOTE — Patient Instructions (Incomplete)
Great to see you again today Recommend a covid booster if not done in the last 6-9 months - do before your trip!  Assuming all is well please see me in 6 months

## 2022-12-23 ENCOUNTER — Encounter: Payer: Self-pay | Admitting: Family Medicine

## 2022-12-23 ENCOUNTER — Ambulatory Visit (INDEPENDENT_AMBULATORY_CARE_PROVIDER_SITE_OTHER): Payer: Medicare HMO | Admitting: Family Medicine

## 2022-12-23 VITALS — BP 120/72 | HR 69 | Temp 97.6°F | Resp 18 | Ht 69.0 in | Wt 243.0 lb

## 2022-12-23 DIAGNOSIS — R7303 Prediabetes: Secondary | ICD-10-CM | POA: Diagnosis not present

## 2022-12-23 DIAGNOSIS — C61 Malignant neoplasm of prostate: Secondary | ICD-10-CM

## 2022-12-23 DIAGNOSIS — E785 Hyperlipidemia, unspecified: Secondary | ICD-10-CM

## 2022-12-23 DIAGNOSIS — R799 Abnormal finding of blood chemistry, unspecified: Secondary | ICD-10-CM

## 2022-12-23 LAB — BASIC METABOLIC PANEL
BUN: 27 mg/dL — ABNORMAL HIGH (ref 6–23)
CO2: 26 mEq/L (ref 19–32)
Calcium: 9.2 mg/dL (ref 8.4–10.5)
Chloride: 105 mEq/L (ref 96–112)
Creatinine, Ser: 0.92 mg/dL (ref 0.40–1.50)
GFR: 82.75 mL/min (ref >60.00)
Glucose, Bld: 122 mg/dL — ABNORMAL HIGH (ref 70–99)
Potassium: 4.8 mEq/L (ref 3.5–5.1)
Sodium: 139 mEq/L (ref 135–145)

## 2022-12-23 LAB — LIPID PANEL
Cholesterol: 129 mg/dL (ref 0–200)
HDL: 68.1 mg/dL (ref >39.00)
LDL Cholesterol: 50 mg/dL (ref 0–99)
NonHDL: 60.85
Total CHOL/HDL Ratio: 2
Triglycerides: 54 mg/dL (ref 0.0–149.0)
VLDL: 10.8 mg/dL (ref 0.0–40.0)

## 2022-12-23 LAB — HEMOGLOBIN A1C: Hgb A1c MFr Bld: 6.3 % (ref 4.6–6.5)

## 2022-12-29 ENCOUNTER — Ambulatory Visit: Payer: Medicare HMO | Admitting: Family Medicine

## 2023-01-04 NOTE — Telephone Encounter (Signed)
Pt would like to have the renal US ordered.

## 2023-01-05 ENCOUNTER — Telehealth: Payer: Self-pay

## 2023-01-05 DIAGNOSIS — R799 Abnormal finding of blood chemistry, unspecified: Secondary | ICD-10-CM

## 2023-01-05 NOTE — Addendum Note (Signed)
Addended by: Lamar Blinks C on: 01/05/2023 12:31 PM   Modules accepted: Orders

## 2023-01-05 NOTE — Addendum Note (Signed)
Addended by: Lamar Blinks C on: 01/05/2023 12:33 PM   Modules accepted: Orders

## 2023-01-05 NOTE — Telephone Encounter (Signed)
Jason Obrien called to let us know that the order that the pt is currently scheduled for will not check for everything that is in the comment section of the order. He says the order " US renal" will cover this. He asks if you would like to have the pt set up for both so that everything is covered? The " US renal"  is scheduled with general radiology.   To confirm or deny this order we can reach Raye Sorrow at: [phone]  Teams or secure chat

## 2023-01-25 ENCOUNTER — Encounter (HOSPITAL_COMMUNITY): Payer: Medicare HMO

## 2023-01-26 ENCOUNTER — Ambulatory Visit (HOSPITAL_BASED_OUTPATIENT_CLINIC_OR_DEPARTMENT_OTHER)
Admission: RE | Admit: 2023-01-26 | Discharge: 2023-01-26 | Disposition: A | Discharge status: Home / Self Care | Payer: Medicare HMO | Source: Ambulatory Visit | Attending: Family Medicine | Admitting: Family Medicine

## 2023-01-26 DIAGNOSIS — R944 Abnormal results of kidney function studies: Secondary | ICD-10-CM | POA: Insufficient documentation

## 2023-01-26 DIAGNOSIS — R7989 Other specified abnormal findings of blood chemistry: Secondary | ICD-10-CM | POA: Diagnosis not present

## 2023-01-26 DIAGNOSIS — R799 Abnormal finding of blood chemistry, unspecified: Secondary | ICD-10-CM | POA: Insufficient documentation

## 2023-01-27 ENCOUNTER — Encounter: Payer: Self-pay | Admitting: Family Medicine

## 2023-04-14 ENCOUNTER — Ambulatory Visit (INDEPENDENT_AMBULATORY_CARE_PROVIDER_SITE_OTHER): Payer: Medicare HMO | Admitting: *Deleted

## 2023-04-14 DIAGNOSIS — Z0000 Encounter for general adult medical examination without abnormal findings: Secondary | ICD-10-CM | POA: Diagnosis not present

## 2023-04-14 NOTE — Progress Notes (Signed)
Subjective:   Jason Obrien is a 72 y.o. male who presents for Medicare Annual/Subsequent preventive examination.  Visit Complete: Virtual  I connected with  Jason Obrien on 04/14/23 by a audio enabled telemedicine application and verified that I am speaking with the correct person using two identifiers.  Patient Location: Home  Provider Location: Office/Clinic  I discussed the limitations of evaluation and management by telemedicine. The patient expressed understanding and agreed to proceed.  Patient Medicare AWV questionnaire was completed by the patient on 04/11/23; I have confirmed that all information answered by patient is correct and no changes since this date.  Review of Systems     Cardiac Risk Factors include: advanced age (>30men, >88 women);male gender;dyslipidemia     Objective:    Today's Vitals   There is no height or weight on file to calculate BMI.     04/14/2023    9:01 AM 04/12/2022    9:04 AM 04/02/2021   11:46 AM 04/01/2020    2:36 PM 05/08/2019    6:00 PM 05/07/2019   10:43 AM 08/02/2016    9:06 AM  Advanced Directives  Does Patient Have a Medical Advance Directive? No No No No No No No  Does patient want to make changes to medical advance directive?    No - Patient declined     Would patient like information on creating a medical advance directive? No - Patient declined No - Patient declined No - Patient declined  No - Patient declined No - Patient declined     Current Medications (verified) Outpatient Encounter Medications as of 04/14/2023  Medication Sig   aspirin EC 81 MG tablet Take 81 mg by mouth daily. Swallow whole.   Glucosamine-Chondroit-Vit C-Mn (GLUCOSAMINE 1500 COMPLEX) CAPS Take by mouth.   lisinopril (ZESTRIL) 10 MG tablet Take 1 tablet (10 mg total) by mouth daily.   metFORMIN (GLUCOPHAGE) 500 MG tablet Take 1 tablet (500 mg total) by mouth daily.   Multiple Vitamins-Minerals (ONE-A-DAY MENS 50+ ADVANTAGE PO) Take 1 capsule by mouth  daily.   Omega-3 Fatty Acids (FISH OIL ULTRA) 1400 MG CAPS Take 1,400 mg by mouth daily.    rosuvastatin (CRESTOR) 20 MG tablet Take 1 tablet (20 mg total) by mouth daily.   No facility-administered encounter medications on file as of 04/14/2023.    Allergies (verified) Patient has no known allergies.   History: Past Medical History:  Diagnosis Date   Arthritis    Cancer (HCC)    Prostate Cancer for SX in Dec.2017   Cataract    FORMING BILATERAL   Chicken pox    childhood   Diabetes mellitus without complication (HCC)    PREDIABETIC   History of kidney stones 03/2018   Hyperlipidemia    Kidney stones    In 2013 or 2014   Pre-diabetes    Past Surgical History:  Procedure Laterality Date   COLONOSCOPY  01/30/2003   OTHER SURGICAL HISTORY     Right arm surgery for compound fracture    QUADRICEPS TENDON REPAIR Left 05/08/2019   Procedure: REPAIR QUADRICEP TENDON;  Surgeon: Durene Romans, MD;  Location[facility];  Service: Orthopedics;  Laterality: Left;  90 mins   robotic assisted lap radical prostatectomy N/A 09/14/2016   prostate cancer- done pe [facility], Dr. Margo Aye   Family History  Problem Relation Age of Onset   Hypertension Father    Heart failure Father        died age 66   Obesity  Father    Varicose Veins Father    Colon cancer Paternal Grandmother    Esophageal cancer Neg Hx    Rectal cancer Neg Hx    Stomach cancer Neg Hx    Social History   Socioeconomic History   Marital status: Married    Spouse name: Not on file   Number of children: Not on file   Years of education: Not on file   Highest education level: Bachelor's degree (e.g., BA, AB, BS)  Occupational History   Not on file  Tobacco Use   Smoking status: Never   Smokeless tobacco: Never  Vaping Use   Vaping status: Never Used  Substance and Sexual Activity   Alcohol use: Yes    Alcohol/week: 3.0 standard drinks of alcohol    Types: 3 Shots of liquor per week    Comment: drink socially mostly on  weekends   Drug use: No   Sexual activity: Yes    Birth control/protection: None  Other Topics Concern   Not on file  Social History Narrative   Not on file   Social Determinants of Health   Financial Resource Strain: Low Risk  (04/11/2023)   Overall Financial Resource Strain (CARDIA)    Difficulty of Paying Living Expenses: Not hard at all  Food Insecurity: No Food Insecurity (04/11/2023)   Hunger Vital Sign    Worried About Running Out of Food in the Last Year: Never true    Ran Out of Food in the Last Year: Never true  Transportation Needs: No Transportation Needs (04/11/2023)   PRAPARE - Administrator, Civil Service (Medical): No    Lack of Transportation (Non-Medical): No  Physical Activity: Sufficiently Active (04/11/2023)   Exercise Vital Sign    Days of Exercise per Week: 4 days    Minutes of Exercise per Session: 50 min  Stress: No Stress Concern Present (04/11/2023)   [employer] of Occupational Health - Occupational Stress Questionnaire    Feeling of Stress : Not at all  Social Connections: Moderately Integrated (04/11/2023)   Social Connection and Isolation Panel [NHANES]    Frequency of Communication with Friends and Family: Three times a week    Frequency of Social Gatherings with Friends and Family: Three times a week    Attends Religious Services: More than 4 times per year    Active Member of Clubs or Organizations: No    Attends Engineer, structural: Patient declined    Marital Status: Married    Tobacco Counseling Counseling given: Not Answered   Clinical Intake:  Pre-visit preparation completed: Yes  Pain : No/denies pain  Nutritional Risks: None Diabetes: No  How often do you need to have someone help you when you read instructions, pamphlets, or other written materials from your doctor or pharmacy?: 1 - Never  Interpreter Needed?: No  Information entered by :: Donne Anon, CMA   Activities of Daily Living    04/11/2023     4:09 PM  In your present state of health, do you have any difficulty performing the following activities:  Hearing? 0  Vision? 0  Difficulty concentrating or making decisions? 0  Walking or climbing stairs? 0  Dressing or bathing? 0  Doing errands, shopping? 0  Preparing Food and eating ? N  Using the Toilet? N  In the past six months, have you accidently leaked urine? N  Do you have problems with loss of bowel control? N  Managing your Medications? N  Managing your Finances? N  Housekeeping or managing your Housekeeping? N    Patient Care Team: Copland, Gwenlyn Found, MD as PCP - General (Family Medicine)  Indicate any recent Medical Services you may have received from other than [facility] providers in the past year (date may be approximate).     Assessment:   This is a routine wellness examination for Jason Obrien.  Hearing/Vision screen No results found.  Dietary issues and exercise activities discussed:     Goals Addressed   None    Depression Screen    04/14/2023    9:00 AM 12/23/2022    8:53 AM 04/12/2022    9:05 AM 04/02/2021   11:50 AM 04/01/2020    2:45 PM 06/13/2017    8:24 AM 05/20/2016    8:32 AM  PHQ 2/9 Scores  PHQ - 2 Score 0 0 0 0 0 0 0    Fall Risk    04/11/2023    4:09 PM 12/23/2022    8:53 AM 04/12/2022    9:04 AM 04/02/2021   11:48 AM 04/01/2020    2:45 PM  Fall Risk   Falls in the past year? 0 0 0 0 1  Number falls in past yr: 0 0 0 0 0  Injury with Fall? 0 0 0 0 1  Risk for fall due to : No Fall Risks No Fall Risks No Fall Risks    Follow up Falls evaluation completed Falls evaluation completed Falls evaluation completed Falls prevention discussed Education provided;Falls prevention discussed    MEDICARE RISK AT HOME:   TIMED UP AND GO:  Was the test performed?  No    Cognitive Function:        04/14/2023    9:02 AM 04/12/2022    9:13 AM  6CIT Screen  What Year? 0 points 0 points  What month? 0 points 0 points  What time? 0 points 0 points   Count back from 20 0 points 0 points  Months in reverse 0 points 0 points  Repeat phrase 0 points 0 points  Total Score 0 points 0 points    Immunizations Immunization History  Administered Date(s) Administered   Fluad Quad(high Dose 65+) 07/04/2019, 06/29/2021   Influenza, High Dose Seasonal PF 07/21/2016, 06/13/2017, 09/11/2018   Influenza,inj,Quad PF,6+ Mos 06/30/2022   Influenza-Unspecified 08/04/2020   Moderna Covid-19 Vaccine Bivalent Booster 16yrs & up 12/23/2022   Moderna Sars-Covid-2 Vaccination 11/03/2019, 12/03/2019, 08/24/2020, 08/17/2021   Pneumococcal Conjugate-13 05/20/2016   Pneumococcal Polysaccharide-23 06/13/2017   Tdap 10/04/2014   Zoster Recombinant(Shingrix) 01/19/2019, 04/09/2019    TDAP status: Up to date  Flu Vaccine status: Up to date  Pneumococcal vaccine status: Up to date  Covid-19 vaccine status: Information provided on how to obtain vaccines.   Qualifies for Shingles Vaccine? Yes   Zostavax completed No   Shingrix Completed?: Yes  Screening Tests Health Maintenance  Topic Date Due   COVID-19 Vaccine (6 - 2023-24 season) 02/17/2023   Medicare Annual Wellness (AWV)  04/13/2023   INFLUENZA VACCINE  05/05/2023   DTaP/Tdap/Td (2 - Td or Tdap) 10/04/2024   Colonoscopy  11/30/2024   Pneumonia Vaccine 65+ Years old  Completed   Hepatitis C Screening  Completed   Zoster Vaccines- Shingrix  Completed   HPV VACCINES  Aged Out    Health Maintenance  Health Maintenance Due  Topic Date Due   COVID-19 Vaccine (6 - 2023-24 season) 02/17/2023   Medicare Annual Wellness (AWV)  04/13/2023    Colorectal cancer  screening: Type of screening: Colonoscopy. Completed 11/30/21. Repeat every 3 years  Lung Cancer Screening: (Low Dose CT Chest recommended if Age 57-80 years, 20 pack-year currently smoking OR have quit w/in 15years.) does not qualify.   Additional Screening:  Hepatitis C Screening: does qualify; Completed 05/20/16  Vision Screening:  Recommended annual ophthalmology exams for early detection of glaucoma and other disorders of the eye. Is the patient up to date with their annual eye exam?  Yes  Who is the provider or what is the name of the office in which the patient attends annual eye exams? [facility] If pt is not established with a provider, would they like to be referred to a provider to establish care? No .   Dental Screening: Recommended annual dental exams for proper oral hygiene  Diabetic Foot Exam: N/a  Community Resource Referral / Chronic Care Management: CRR required this visit?  No   CCM required this visit?  No     Plan:     I have personally reviewed and noted the following in the patient's chart:   Medical and social history Use of alcohol, tobacco or illicit drugs  Current medications and supplements including opioid prescriptions. Patient is not currently taking opioid prescriptions. Functional ability and status Nutritional status Physical activity Advanced directives List of other physicians Hospitalizations, surgeries, and ER visits in previous 12 months Vitals Screenings to include cognitive, depression, and falls Referrals and appointments  In addition, I have reviewed and discussed with patient certain preventive protocols, quality metrics, and best practice recommendations. A written personalized care plan for preventive services as well as general preventive health recommendations were provided to patient.     Donne Anon, CMA   04/14/2023   After Visit Summary: (MyChart) Due to this being a telephonic visit, the after visit summary with patients personalized plan was offered to patient via MyChart   Nurse Notes: None

## 2023-04-14 NOTE — Patient Instructions (Signed)
[name] , Thank you for taking time to come for your Medicare Wellness Visit. I appreciate your ongoing commitment to your health goals. Please review the following plan we discussed and let me know if I can assist you in the future.     This is a list of the screening recommended for you and due dates:  Health Maintenance  Topic Date Due   COVID-19 Vaccine (6 - 2023-24 season) 02/17/2023   Flu Shot  05/05/2023   Medicare Annual Wellness Visit  04/13/2024   DTaP/Tdap/Td vaccine (2 - Td or Tdap) 10/04/2024   Colon Cancer Screening  11/30/2024   Pneumonia Vaccine  Completed   Hepatitis C Screening  Completed   Zoster (Shingles) Vaccine  Completed   HPV Vaccine  Aged Out     Next appointment: Follow up in one year for your annual wellness visit.   Preventive Care 73 Years and Older, Male Preventive care refers to lifestyle choices and visits with your health care provider that can promote health and wellness. What does preventive care include? A yearly physical exam. This is also called an annual well check. Dental exams once or twice a year. Routine eye exams. Ask your health care provider how often you should have your eyes checked. Personal lifestyle choices, including: Daily care of your teeth and gums. Regular physical activity. Eating a healthy diet. Avoiding tobacco and drug use. Limiting alcohol use. Practicing safe sex. Taking low doses of aspirin every day. Taking vitamin and mineral supplements as recommended by your health care provider. What happens during an annual well check? The services and screenings done by your health care provider during your annual well check will depend on your age, overall health, lifestyle risk factors, and family history of disease. Counseling  Your health care provider may ask you questions about your: Alcohol use. Tobacco use. Drug use. Emotional well-being. Home and relationship well-being. Sexual activity. Eating  habits. History of falls. Memory and ability to understand (cognition). Work and work Astronomer. Screening  You may have the following tests or measurements: Height, weight, and BMI. Blood pressure. Lipid and cholesterol levels. These may be checked every 5 years, or more frequently if you are over 58 years old. Skin check. Lung cancer screening. You may have this screening every year starting at age 85 if you have a 30-pack-year history of smoking and currently smoke or have quit within the past 15 years. Fecal occult blood test (FOBT) of the stool. You may have this test every year starting at age 36. Flexible sigmoidoscopy or colonoscopy. You may have a sigmoidoscopy every 5 years or a colonoscopy every 10 years starting at age 61. Prostate cancer screening. Recommendations will vary depending on your family history and other risks. Hepatitis C blood test. Hepatitis B blood test. Sexually transmitted disease (STD) testing. Diabetes screening. This is done by checking your blood sugar (glucose) after you have not eaten for a while (fasting). You may have this done every 1-3 years. Abdominal aortic aneurysm (AAA) screening. You may need this if you are a current or former smoker. Osteoporosis. You may be screened starting at age 20 if you are at high risk. Talk with your health care provider about your test results, treatment options, and if necessary, the need for more tests. Vaccines  Your health care provider may recommend certain vaccines, such as: Influenza vaccine. This is recommended every year. Tetanus, diphtheria, and acellular pertussis (Tdap, Td) vaccine. You may need a Td booster every 10  years. Zoster vaccine. You may need this after age 83. Pneumococcal 13-valent conjugate (PCV13) vaccine. One dose is recommended after age 45. Pneumococcal polysaccharide (PPSV23) vaccine. One dose is recommended after age 65. Talk to your health care provider about which screenings and  vaccines you need and how often you need them. This information is not intended to replace advice given to you by your health care provider. Make sure you discuss any questions you have with your health care provider. Document Released: 10/17/2015 Document Revised: 06/09/2016 Document Reviewed: 07/22/2015 Elsevier Interactive Patient Education  2017 [employer].  Fall Prevention in the Home Falls can cause injuries. They can happen to people of all ages. There are many things you can do to make your home safe and to help prevent falls. What can I do on the outside of my home? Regularly fix the edges of walkways and driveways and fix any cracks. Remove anything that might make you trip as you walk through a door, such as a raised step or threshold. Trim any bushes or trees on the path to your home. Use bright outdoor lighting. Clear any walking paths of anything that might make someone trip, such as rocks or tools. Regularly check to see if handrails are loose or broken. Make sure that both sides of any steps have handrails. Any raised decks and porches should have guardrails on the edges. Have any leaves, snow, or ice cleared regularly. Use sand or salt on walking paths during winter. Clean up any spills in your garage right away. This includes oil or grease spills. What can I do in the bathroom? Use night lights. Install grab bars by the toilet and in the tub and shower. Do not use towel bars as grab bars. Use non-skid mats or decals in the tub or shower. If you need to sit down in the shower, use a plastic, non-slip stool. Keep the floor dry. Clean up any water that spills on the floor as soon as it happens. Remove soap buildup in the tub or shower regularly. Attach bath mats securely with double-sided non-slip rug tape. Do not have throw rugs and other things on the floor that can make you trip. What can I do in the bedroom? Use night lights. Make sure that you have a light by your  bed that is easy to reach. Do not use any sheets or blankets that are too big for your bed. They should not hang down onto the floor. Have a firm chair that has side arms. You can use this for support while you get dressed. Do not have throw rugs and other things on the floor that can make you trip. What can I do in the kitchen? Clean up any spills right away. Avoid walking on wet floors. Keep items that you use a lot in easy-to-reach places. If you need to reach something above you, use a strong step stool that has a grab bar. Keep electrical cords out of the way. Do not use floor polish or wax that makes floors slippery. If you must use wax, use non-skid floor wax. Do not have throw rugs and other things on the floor that can make you trip. What can I do with my stairs? Do not leave any items on the stairs. Make sure that there are handrails on both sides of the stairs and use them. Fix handrails that are broken or loose. Make sure that handrails are as long as the stairways. Check any carpeting to make sure  that it is firmly attached to the stairs. Fix any carpet that is loose or worn. Avoid having throw rugs at the top or bottom of the stairs. If you do have throw rugs, attach them to the floor with carpet tape. Make sure that you have a light switch at the top of the stairs and the bottom of the stairs. If you do not have them, ask someone to add them for you. What else can I do to help prevent falls? Wear shoes that: Do not have high heels. Have rubber bottoms. Are comfortable and fit you well. Are closed at the toe. Do not wear sandals. If you use a stepladder: Make sure that it is fully opened. Do not climb a closed stepladder. Make sure that both sides of the stepladder are locked into place. Ask someone to hold it for you, if possible. Clearly mark and make sure that you can see: Any grab bars or handrails. First and last steps. Where the edge of each step is. Use tools that  help you move around (mobility aids) if they are needed. These include: Canes. Walkers. Scooters. Crutches. Turn on the lights when you go into a dark area. Replace any light bulbs as soon as they burn out. Set up your furniture so you have a clear path. Avoid moving your furniture around. If any of your floors are uneven, fix them. If there are any pets around you, be aware of where they are. Review your medicines with your doctor. Some medicines can make you feel dizzy. This can increase your chance of falling. Ask your doctor what other things that you can do to help prevent falls. This information is not intended to replace advice given to you by your health care provider. Make sure you discuss any questions you have with your health care provider. Document Released: 07/17/2009 Document Revised: 02/26/2016 Document Reviewed: 10/25/2014 Elsevier Interactive Patient Education  2017 [employer].

## 2023-05-19 ENCOUNTER — Encounter (INDEPENDENT_AMBULATORY_CARE_PROVIDER_SITE_OTHER): Payer: Self-pay

## 2023-05-25 DIAGNOSIS — N132 Hydronephrosis with renal and ureteral calculous obstruction: Secondary | ICD-10-CM | POA: Diagnosis not present

## 2023-05-25 DIAGNOSIS — R1084 Generalized abdominal pain: Secondary | ICD-10-CM | POA: Diagnosis not present

## 2023-05-25 DIAGNOSIS — N130 Hydronephrosis with ureteropelvic junction obstruction: Secondary | ICD-10-CM | POA: Diagnosis not present

## 2023-05-25 DIAGNOSIS — K5730 Diverticulosis of large intestine without perforation or abscess without bleeding: Secondary | ICD-10-CM | POA: Diagnosis not present

## 2023-05-25 DIAGNOSIS — R109 Unspecified abdominal pain: Secondary | ICD-10-CM | POA: Diagnosis not present

## 2023-06-10 ENCOUNTER — Other Ambulatory Visit: Payer: Self-pay | Admitting: Family Medicine

## 2023-06-10 DIAGNOSIS — E785 Hyperlipidemia, unspecified: Secondary | ICD-10-CM

## 2023-06-10 DIAGNOSIS — R030 Elevated blood-pressure reading, without diagnosis of hypertension: Secondary | ICD-10-CM

## 2023-06-23 ENCOUNTER — Ambulatory Visit: Payer: Medicare HMO | Admitting: Family Medicine

## 2023-06-28 NOTE — Progress Notes (Unsigned)
[facility] [address] [place], [place] [address] [phone]  Date:  06/29/2023   Name:  Jason Obrien   DOB:  11-26-1950   MRN:  [id number]  PCP:  Pearline Cables, MD    Chief Complaint: No chief complaint on file.   History of Present Illness:  Jason Obrien is a 72 y.o. very pleasant male patient who presents with the following:  Patient seen today for periodic follow-up Most recent visit with myself was in March of this year-at that time he was looking forward to an upcoming trip to [place] History of dyslipidemia, prediabetes, prostate cancer status post prostatectomy 2017, left quad tear 2020 which was repaired in August 2020   He is retired from his work as a Nurse, mental health Married to [place], they have 2 children and 2 grandchildren who live in [place].    He sees Dr Laverle Patter with urology for prostate ca surveillance   Flu vaccine COVID booster Most recent blood work completed in Chief Strategy Officer under excellent control on Crestor, A1c 6.3% Can do a PSA for patient today if he would like  Aspirin 81 Lisinopril 10 Metformin 500 daily Omega-3 Crestor 20   Patient Active Problem List   Diagnosis Date Noted   Ascending aorta dilatation (HCC) 07/05/2022   Left quad rutpure 05/08/2019   Dyslipidemia 06/14/2017   Prostate cancer (HCC) 07/21/2016   Pre-diabetes 07/21/2016   Nephrolithiasis 07/07/2016   Elevated PSA 05/31/2016   Overweight 05/20/2016    Past Medical History:  Diagnosis Date   Arthritis    Cancer (HCC)    Prostate Cancer for SX in Dec.2017   Cataract    FORMING BILATERAL   Chicken pox    childhood   Diabetes mellitus without complication (HCC)    PREDIABETIC   History of kidney stones 03/2018   Hyperlipidemia    Kidney stones    In 2013 or 2014   Pre-diabetes     Past Surgical History:  Procedure Laterality Date   COLONOSCOPY  01/30/2003    OTHER SURGICAL HISTORY     Right arm surgery for compound fracture    QUADRICEPS TENDON REPAIR Left 05/08/2019   Procedure: REPAIR QUADRICEP TENDON;  Surgeon: Durene Romans, MD;  Location: [facility];  Service: Orthopedics;  Laterality: Left;  90 mins   robotic assisted lap radical prostatectomy N/A 09/14/2016   prostate cancer- done pe [facility], Dr. Margo Aye    Social History   Tobacco Use   Smoking status: Never   Smokeless tobacco: Never  Vaping Use   Vaping status: Never Used  Substance Use Topics   Alcohol use: Yes    Alcohol/week: 3.0 standard drinks of alcohol    Types: 3 Shots of liquor per week    Comment: drink socially mostly on weekends   Drug use: No    Family History  Problem Relation Age of Onset   Hypertension Father    Heart failure Father        died age 36   Obesity Father    Varicose Veins Father    Colon cancer Paternal Grandmother    Esophageal cancer Neg Hx    Rectal cancer Neg Hx    Stomach cancer Neg Hx     No Known Allergies  Medication list has been reviewed and updated.  Current Outpatient Medications on File Prior to Visit  Medication Sig Dispense Refill   aspirin EC  81 MG tablet Take 81 mg by mouth daily. Swallow whole.     Glucosamine-Chondroit-Vit C-Mn (GLUCOSAMINE 1500 COMPLEX) CAPS Take by mouth.     lisinopril (ZESTRIL) 10 MG tablet TAKE 1 TABLET BY MOUTH EVERY DAY 90 tablet 3   metFORMIN (GLUCOPHAGE) 500 MG tablet Take 1 tablet (500 mg total) by mouth daily. 90 tablet 3   Multiple Vitamins-Minerals (ONE-A-DAY MENS 50+ ADVANTAGE PO) Take 1 capsule by mouth daily.     Omega-3 Fatty Acids (FISH OIL ULTRA) 1400 MG CAPS Take 1,400 mg by mouth daily.      rosuvastatin (CRESTOR) 20 MG tablet TAKE 1 TABLET BY MOUTH EVERY DAY 90 tablet 3   No current facility-administered medications on file prior to visit.    Review of Systems:  As per HPI- otherwise negative.   Physical Examination: There were no vitals filed for this visit. There were no  vitals filed for this visit. There is no height or weight on file to calculate BMI. Ideal Body Weight:    GEN: no acute distress. HEENT: Atraumatic, Normocephalic.  Ears and Nose: No external deformity. CV: RRR, No M/G/R. No JVD. No thrill. No extra heart sounds. PULM: CTA B, no wheezes, crackles, rhonchi. No retractions. No resp. distress. No accessory muscle use. ABD: S, NT, ND, +BS. No rebound. No HSM. EXTR: No c/c/e PSYCH: Normally interactive. Conversant.    Assessment and Plan: ***  Signed Abbe Amsterdam, MD

## 2023-06-28 NOTE — Patient Instructions (Incomplete)
It was wonderful to see you again today!  Assuming all is well lets plan to visit in about 6 months Recommend COVID booster this fall Flu shot given today I will be in touch with your labs asap  Continue to work on exercise and weight

## 2023-06-29 ENCOUNTER — Ambulatory Visit: Payer: Medicare HMO | Admitting: Family Medicine

## 2023-06-29 ENCOUNTER — Encounter: Payer: Self-pay | Admitting: Family Medicine

## 2023-06-29 VITALS — BP 122/60 | HR 66 | Temp 97.6°F | Resp 18 | Ht 69.0 in | Wt 247.6 lb

## 2023-06-29 DIAGNOSIS — R7303 Prediabetes: Secondary | ICD-10-CM

## 2023-06-29 DIAGNOSIS — E875 Hyperkalemia: Secondary | ICD-10-CM | POA: Diagnosis not present

## 2023-06-29 DIAGNOSIS — E785 Hyperlipidemia, unspecified: Secondary | ICD-10-CM

## 2023-06-29 DIAGNOSIS — I499 Cardiac arrhythmia, unspecified: Secondary | ICD-10-CM | POA: Diagnosis not present

## 2023-06-29 DIAGNOSIS — Z23 Encounter for immunization: Secondary | ICD-10-CM

## 2023-06-29 DIAGNOSIS — C61 Malignant neoplasm of prostate: Secondary | ICD-10-CM | POA: Diagnosis not present

## 2023-06-29 DIAGNOSIS — R030 Elevated blood-pressure reading, without diagnosis of hypertension: Secondary | ICD-10-CM

## 2023-06-29 LAB — CBC
HCT: 41.1 % (ref 39.0–52.0)
Hemoglobin: 13.5 g/dL (ref 13.0–17.0)
MCHC: 32.9 g/dL (ref 30.0–36.0)
MCV: 100.6 fl — ABNORMAL HIGH (ref 78.0–100.0)
Platelets: 223 10*3/uL (ref 150.0–400.0)
RBC: 4.08 Mil/uL — ABNORMAL LOW (ref 4.22–5.81)
RDW: 13.5 % (ref 11.5–15.5)
WBC: 4.5 10*3/uL (ref 4.0–10.5)

## 2023-06-29 LAB — COMPREHENSIVE METABOLIC PANEL
ALT: 21 U/L (ref 0–53)
AST: 19 U/L (ref 0–37)
Albumin: 4.2 g/dL (ref 3.5–5.2)
Alkaline Phosphatase: 52 U/L (ref 39–117)
BUN: 20 mg/dL (ref 6–23)
CO2: 30 mEq/L (ref 19–32)
Calcium: 9.3 mg/dL (ref 8.4–10.5)
Chloride: 102 mEq/L (ref 96–112)
Creatinine, Ser: 0.89 mg/dL (ref 0.40–1.50)
GFR: 84.94 mL/min (ref >60.00)
Glucose, Bld: 115 mg/dL — ABNORMAL HIGH (ref 70–99)
Potassium: 5.3 mEq/L — ABNORMAL HIGH (ref 3.5–5.1)
Sodium: 139 mEq/L (ref 135–145)
Total Bilirubin: 1 mg/dL (ref 0.2–1.2)
Total Protein: 6.6 g/dL (ref 6.0–8.3)

## 2023-06-29 LAB — PSA: PSA: 0 ng/mL — ABNORMAL LOW (ref 0.10–4.00)

## 2023-06-29 LAB — HEMOGLOBIN A1C: Hgb A1c MFr Bld: 6.3 % (ref 4.6–6.5)

## 2023-08-15 DIAGNOSIS — N200 Calculus of kidney: Secondary | ICD-10-CM | POA: Diagnosis not present

## 2023-08-16 DIAGNOSIS — N200 Calculus of kidney: Secondary | ICD-10-CM | POA: Diagnosis not present

## 2023-09-07 ENCOUNTER — Other Ambulatory Visit: Payer: Self-pay | Admitting: Family Medicine

## 2023-09-07 DIAGNOSIS — R7303 Prediabetes: Secondary | ICD-10-CM

## 2023-09-14 ENCOUNTER — Encounter: Payer: Self-pay | Admitting: Family Medicine

## 2023-10-12 DIAGNOSIS — D225 Melanocytic nevi of trunk: Secondary | ICD-10-CM | POA: Diagnosis not present

## 2023-10-12 DIAGNOSIS — L820 Inflamed seborrheic keratosis: Secondary | ICD-10-CM | POA: Diagnosis not present

## 2023-10-12 DIAGNOSIS — D485 Neoplasm of uncertain behavior of skin: Secondary | ICD-10-CM | POA: Diagnosis not present

## 2023-10-12 DIAGNOSIS — L570 Actinic keratosis: Secondary | ICD-10-CM | POA: Diagnosis not present

## 2023-10-12 DIAGNOSIS — Z1283 Encounter for screening for malignant neoplasm of skin: Secondary | ICD-10-CM | POA: Diagnosis not present

## 2023-10-12 DIAGNOSIS — L821 Other seborrheic keratosis: Secondary | ICD-10-CM | POA: Diagnosis not present

## 2023-10-12 DIAGNOSIS — X32XXXA Exposure to sunlight, initial encounter: Secondary | ICD-10-CM | POA: Diagnosis not present

## 2023-12-26 NOTE — Progress Notes (Unsigned)
 [facility] at [facility] [address] [place], [place] [address] [phone]  Date:  12/28/2023   Name:  Jason Obrien   DOB:  02-12-1951   MRN:  [id number]  PCP:  Pearline Cables, MD    Chief Complaint: No chief complaint on file.   History of Present Illness:  Jason Obrien is a 73 y.o. very pleasant male patient who presents with the following:  Pt seen today for a periodic recheck visit Last seen by myself about 6 months ago  History of dyslipidemia, prediabetes, prostate cancer status post prostatectomy 2017, left quad tear 2020 which was repaired in August 2020   He is retired from his work as a Nurse, mental health Married to [place], they have 2 children and 2 grandchildren who live in [place].    He sees Dr Laverle Patter with urology for prostate ca surveillance   Labs completed in September- CMP, CBC Colon 2023  Asa 81 Lisinporil Metformin Crestor Omega 3  Lab Results  Component Value Date   HGBA1C 6.3 06/29/2023     Patient Active Problem List   Diagnosis Date Noted   Ascending aorta dilatation (HCC) 07/05/2022   Left quad rutpure 05/08/2019   Dyslipidemia 06/14/2017   Prostate cancer (HCC) 07/21/2016   Pre-diabetes 07/21/2016   Nephrolithiasis 07/07/2016   Elevated PSA 05/31/2016   Overweight 05/20/2016    Past Medical History:  Diagnosis Date   Arthritis    Cancer (HCC)    Prostate Cancer for SX in Dec.2017   Cataract    FORMING BILATERAL   Chicken pox    childhood   Diabetes mellitus without complication (HCC)    PREDIABETIC   History of kidney stones 03/2018   Hyperlipidemia    Kidney stones    In 2013 or 2014   Pre-diabetes     Past Surgical History:  Procedure Laterality Date   COLONOSCOPY  01/30/2003   OTHER SURGICAL HISTORY     Right arm surgery for compound fracture    QUADRICEPS TENDON REPAIR Left 05/08/2019   Procedure: REPAIR QUADRICEP TENDON;  Surgeon:  Durene Romans, MD;  Location: [facility];  Service: Orthopedics;  Laterality: Left;  90 mins   robotic assisted lap radical prostatectomy N/A 09/14/2016   prostate cancer- done pe [facility], Dr. Margo Aye    Social History   Tobacco Use   Smoking status: Never   Smokeless tobacco: Never  Vaping Use   Vaping status: Never Used  Substance Use Topics   Alcohol use: Yes    Alcohol/week: 3.0 standard drinks of alcohol    Types: 3 Shots of liquor per week    Comment: drink socially mostly on weekends   Drug use: No    Family History  Problem Relation Age of Onset   Hypertension Father    Heart failure Father        died age 59   Obesity Father    Varicose Veins Father    Colon cancer Paternal Grandmother    Esophageal cancer Neg Hx    Rectal cancer Neg Hx    Stomach cancer Neg Hx     No Known Allergies  Medication list has been reviewed and updated.  Current Outpatient Medications on File Prior to Visit  Medication Sig Dispense Refill   aspirin EC 81 MG tablet Take 81 mg by mouth daily. Swallow whole.     Glucosamine-Chondroit-Vit C-Mn (GLUCOSAMINE 1500 COMPLEX) CAPS Take  by mouth.     lisinopril (ZESTRIL) 10 MG tablet TAKE 1 TABLET BY MOUTH EVERY DAY 90 tablet 3   metFORMIN (GLUCOPHAGE) 500 MG tablet TAKE 1 TABLET (500 MG TOTAL) BY MOUTH DAILY. 90 tablet 3   Multiple Vitamins-Minerals (ONE-A-DAY MENS 50+ ADVANTAGE PO) Take 1 capsule by mouth daily.     Omega-3 Fatty Acids (FISH OIL ULTRA) 1400 MG CAPS Take 1,400 mg by mouth daily.      rosuvastatin (CRESTOR) 20 MG tablet TAKE 1 TABLET BY MOUTH EVERY DAY 90 tablet 3   No current facility-administered medications on file prior to visit.    Review of Systems:  As per HPI- otherwise negative.   Physical Examination: There were no vitals filed for this visit. There were no vitals filed for this visit. There is no height or weight on file to calculate BMI. Ideal Body Weight:    GEN: no acute distress. HEENT: Atraumatic,  Normocephalic.  Ears and Nose: No external deformity. CV: RRR, No M/G/R. No JVD. No thrill. No extra heart sounds. PULM: CTA B, no wheezes, crackles, rhonchi. No retractions. No resp. distress. No accessory muscle use. ABD: S, NT, ND, +BS. No rebound. No HSM. EXTR: No c/c/e PSYCH: Normally interactive. Conversant.    Assessment and Plan: ***  Signed Abbe Amsterdam, MD

## 2023-12-26 NOTE — Patient Instructions (Incomplete)
 Good to see you again today Recommend dose of RSV, covid booster if none in the last 6 months or so - looks like your covid is up to date  I will set up a scan of your aorta to make sure all is well  Please see me in 6 months

## 2023-12-28 ENCOUNTER — Encounter: Payer: Self-pay | Admitting: Family Medicine

## 2023-12-28 ENCOUNTER — Ambulatory Visit (INDEPENDENT_AMBULATORY_CARE_PROVIDER_SITE_OTHER): Payer: Medicare HMO | Admitting: Family Medicine

## 2023-12-28 VITALS — BP 136/82 | HR 71 | Temp 97.6°F | Resp 18 | Ht 69.0 in | Wt 246.2 lb

## 2023-12-28 DIAGNOSIS — I77810 Thoracic aortic ectasia: Secondary | ICD-10-CM

## 2023-12-28 DIAGNOSIS — R030 Elevated blood-pressure reading, without diagnosis of hypertension: Secondary | ICD-10-CM

## 2023-12-28 DIAGNOSIS — R7303 Prediabetes: Secondary | ICD-10-CM | POA: Diagnosis not present

## 2023-12-28 DIAGNOSIS — E785 Hyperlipidemia, unspecified: Secondary | ICD-10-CM | POA: Diagnosis not present

## 2023-12-28 DIAGNOSIS — E875 Hyperkalemia: Secondary | ICD-10-CM

## 2023-12-28 LAB — HEMOGLOBIN A1C: Hgb A1c MFr Bld: 6.2 % (ref 4.6–6.5)

## 2023-12-28 LAB — LIPID PANEL
Cholesterol: 117 mg/dL (ref 0–200)
HDL: 48.5 mg/dL (ref >39.00)
LDL Cholesterol: 57 mg/dL (ref 0–99)
NonHDL: 68.65
Total CHOL/HDL Ratio: 2
Triglycerides: 59 mg/dL (ref 0.0–149.0)
VLDL: 11.8 mg/dL (ref 0.0–40.0)

## 2023-12-28 LAB — CBC
HCT: 42.6 % (ref 39.0–52.0)
Hemoglobin: 14 g/dL (ref 13.0–17.0)
MCHC: 32.9 g/dL (ref 30.0–36.0)
MCV: 100.9 fl — ABNORMAL HIGH (ref 78.0–100.0)
Platelets: 254 10*3/uL (ref 150.0–400.0)
RBC: 4.22 Mil/uL (ref 4.22–5.81)
RDW: 13.6 % (ref 11.5–15.5)
WBC: 5.6 10*3/uL (ref 4.0–10.5)

## 2023-12-28 LAB — BASIC METABOLIC PANEL
BUN: 19 mg/dL (ref 6–23)
CO2: 24 meq/L (ref 19–32)
Calcium: 9.2 mg/dL (ref 8.4–10.5)
Chloride: 103 meq/L (ref 96–112)
Creatinine, Ser: 0.77 mg/dL (ref 0.40–1.50)
GFR: 88.43 mL/min (ref >60.00)
Glucose, Bld: 127 mg/dL — ABNORMAL HIGH (ref 70–99)
Potassium: 4.4 meq/L (ref 3.5–5.1)
Sodium: 137 meq/L (ref 135–145)

## 2024-02-14 DIAGNOSIS — D485 Neoplasm of uncertain behavior of skin: Secondary | ICD-10-CM | POA: Diagnosis not present

## 2024-02-14 DIAGNOSIS — B351 Tinea unguium: Secondary | ICD-10-CM | POA: Diagnosis not present

## 2024-02-14 DIAGNOSIS — L570 Actinic keratosis: Secondary | ICD-10-CM | POA: Diagnosis not present

## 2024-02-14 DIAGNOSIS — D225 Melanocytic nevi of trunk: Secondary | ICD-10-CM | POA: Diagnosis not present

## 2024-02-14 DIAGNOSIS — B353 Tinea pedis: Secondary | ICD-10-CM | POA: Diagnosis not present

## 2024-02-14 DIAGNOSIS — X32XXXD Exposure to sunlight, subsequent encounter: Secondary | ICD-10-CM | POA: Diagnosis not present

## 2024-02-14 DIAGNOSIS — D2262 Melanocytic nevi of left upper limb, including shoulder: Secondary | ICD-10-CM | POA: Diagnosis not present

## 2024-02-14 DIAGNOSIS — Z1283 Encounter for screening for malignant neoplasm of skin: Secondary | ICD-10-CM | POA: Diagnosis not present

## 2024-03-14 ENCOUNTER — Telehealth: Payer: Self-pay | Admitting: Family Medicine

## 2024-03-14 DIAGNOSIS — Z5181 Encounter for therapeutic drug level monitoring: Secondary | ICD-10-CM

## 2024-03-14 NOTE — Telephone Encounter (Signed)
 Copied from CRM [id number]. Topic: Appointments - Scheduling Inquiry for Clinic >> Mar 14, 2024  2:13 PM Chuck Crater wrote: Reason for CRM: Patient is needing labs done before his CT Scan on Friday.

## 2024-03-15 ENCOUNTER — Other Ambulatory Visit

## 2024-03-15 DIAGNOSIS — E875 Hyperkalemia: Secondary | ICD-10-CM | POA: Diagnosis not present

## 2024-03-15 NOTE — Addendum Note (Signed)
 Addended by: Marigene Shoulder on: 03/15/2024 08:49 AM   Modules accepted: Orders

## 2024-03-16 ENCOUNTER — Ambulatory Visit (HOSPITAL_BASED_OUTPATIENT_CLINIC_OR_DEPARTMENT_OTHER)
Admission: RE | Admit: 2024-03-16 | Discharge: 2024-03-16 | Disposition: A | Discharge status: Home / Self Care | Source: Ambulatory Visit | Attending: Family Medicine | Admitting: Family Medicine

## 2024-03-16 ENCOUNTER — Encounter (HOSPITAL_BASED_OUTPATIENT_CLINIC_OR_DEPARTMENT_OTHER): Payer: Self-pay

## 2024-03-16 ENCOUNTER — Ambulatory Visit: Payer: Self-pay | Admitting: Family Medicine

## 2024-03-16 ENCOUNTER — Encounter: Payer: Self-pay | Admitting: Family Medicine

## 2024-03-16 DIAGNOSIS — I719 Aortic aneurysm of unspecified site, without rupture: Secondary | ICD-10-CM | POA: Diagnosis not present

## 2024-03-16 DIAGNOSIS — I2510 Atherosclerotic heart disease of native coronary artery without angina pectoris: Secondary | ICD-10-CM | POA: Diagnosis not present

## 2024-03-16 DIAGNOSIS — I77810 Thoracic aortic ectasia: Secondary | ICD-10-CM | POA: Diagnosis not present

## 2024-03-16 DIAGNOSIS — K8020 Calculus of gallbladder without cholecystitis without obstruction: Secondary | ICD-10-CM | POA: Diagnosis not present

## 2024-03-16 LAB — BASIC METABOLIC PANEL WITH GFR
BUN: 24 mg/dL (ref 7–25)
CO2: 22 mmol/L (ref 20–32)
Calcium: 9.2 mg/dL (ref 8.6–10.3)
Chloride: 107 mmol/L (ref 98–110)
Creat: 0.81 mg/dL (ref 0.70–1.28)
Glucose, Bld: 122 mg/dL — ABNORMAL HIGH (ref 65–99)
Potassium: 4.4 mmol/L (ref 3.5–5.3)
Sodium: 141 mmol/L (ref 135–146)
eGFR: 93 mL/min/{1.73_m2} (ref >=60)

## 2024-03-16 LAB — TEST AUTHORIZATION

## 2024-03-16 LAB — POCT I-STAT CREATININE: Creatinine, Ser: 0.9 mg/dL (ref 0.61–1.24)

## 2024-03-16 LAB — POTASSIUM: Potassium: 4.4 mmol/L (ref 3.5–5.3)

## 2024-03-16 MED ORDER — IOHEXOL 350 MG/ML SOLN
75.0000 mL | Freq: Once | INTRAVENOUS | Status: AC | PRN
Start: 2024-03-16 — End: 2024-03-16
  Administered 2024-03-16: 100 mL via INTRAVENOUS

## 2024-03-25 ENCOUNTER — Encounter: Payer: Self-pay | Admitting: Family Medicine

## 2024-04-18 ENCOUNTER — Ambulatory Visit (INDEPENDENT_AMBULATORY_CARE_PROVIDER_SITE_OTHER)

## 2024-04-18 VITALS — BP 136/82 | Ht 69.0 in | Wt 240.0 lb

## 2024-04-18 DIAGNOSIS — Z2821 Immunization not carried out because of patient refusal: Secondary | ICD-10-CM | POA: Diagnosis not present

## 2024-04-18 DIAGNOSIS — Z0000 Encounter for general adult medical examination without abnormal findings: Secondary | ICD-10-CM | POA: Diagnosis not present

## 2024-04-18 NOTE — Progress Notes (Signed)
 Because this visit was a virtual/telehealth visit,  certain criteria was not obtained, such a blood pressure, CBG if applicable, and timed get up and go. Any medications not marked as taking were not mentioned during the medication reconciliation part of the visit. Any vitals not documented were not able to be obtained due to this being a telehealth visit or patient was unable to self-report a recent blood pressure reading due to a lack of equipment at home via telehealth. Vitals that have been documented are verbally provided by the patient.  This visit was performed by a medical professional under my direct supervision. I was immediately available for consultation/collaboration. I have reviewed and agree with the Annual Wellness Visit documentation.  Subjective:   Jason Obrien is a 73 y.o. who presents for a Medicare Wellness preventive visit.  As a reminder, Annual Wellness Visits don't include a physical exam, and some assessments may be limited, especially if this visit is performed virtually. We may recommend an in-person follow-up visit with your provider if needed.  Visit Complete: Virtual I connected with  [name] on 04/18/24 by a audio enabled telemedicine application and verified that I am speaking with the correct person using two identifiers.  Patient Location: Home  Provider Location: Home Office  I discussed the limitations of evaluation and management by telemedicine. The patient expressed understanding and agreed to proceed.  Vital Signs: Because this visit was a virtual/telehealth visit, some criteria may be missing or patient reported. Any vitals not documented were not able to be obtained and vitals that have been documented are patient reported.  VideoDeclined- This patient declined Librarian, academic. Therefore the visit was completed with audio only.  Persons Participating in Visit: Patient.  AWV Questionnaire: Yes: Patient  Medicare AWV questionnaire was completed by the patient on 04/16/2024; I have confirmed that all information answered by patient is correct and no changes since this date.  Cardiac Risk Factors include: male gender;advanced age (>45men, >25 women);dyslipidemia     Objective:    Today's Vitals   04/18/24 1354  BP: 136/82  Weight: 240 lb (108.9 kg)  Height: 5' 9 (1.753 m)   Body mass index is 35.44 kg/m.     04/18/2024    1:57 PM 04/14/2023    9:01 AM 04/12/2022    9:04 AM 04/02/2021   11:46 AM 04/01/2020    2:36 PM 05/08/2019    6:00 PM 05/07/2019   10:43 AM  Advanced Directives  Does Patient Have a Medical Advance Directive? No No No No No No No  Does patient want to make changes to medical advance directive?     No - Patient declined    Would patient like information on creating a medical advance directive? No - Patient declined No - Patient declined No - Patient declined No - Patient declined  No - Patient declined  No - Patient declined      Data saved with a previous flowsheet row definition    Current Medications (verified) Outpatient Encounter Medications as of 04/18/2024  Medication Sig   aspirin  EC 81 MG tablet Take 81 mg by mouth daily. Swallow whole.   Glucosamine-Chondroit-Vit C-Mn (GLUCOSAMINE 1500 COMPLEX) CAPS Take by mouth.   lisinopril  (ZESTRIL ) 10 MG tablet TAKE 1 TABLET BY MOUTH EVERY DAY   metFORMIN  (GLUCOPHAGE ) 500 MG tablet TAKE 1 TABLET (500 MG TOTAL) BY MOUTH DAILY.   Multiple Vitamins-Minerals (ONE-A-DAY MENS 50+ ADVANTAGE PO) Take 1 capsule by mouth daily.  Omega-3 Fatty Acids (FISH OIL ULTRA) 1400 MG CAPS Take 1,400 mg by mouth daily.    rosuvastatin  (CRESTOR ) 20 MG tablet TAKE 1 TABLET BY MOUTH EVERY DAY   No facility-administered encounter medications on file as of 04/18/2024.    Allergies (verified) Patient has no known allergies.   History: Past Medical History:  Diagnosis Date   Arthritis    Cancer (HCC)    Prostate Cancer for SX in  Dec.2017   Cataract    FORMING BILATERAL   Chicken pox    childhood   Diabetes mellitus without complication (HCC)    PREDIABETIC   History of kidney stones 03/2018   Hyperlipidemia    Kidney stones    In 2013 or 2014   Pre-diabetes    Past Surgical History:  Procedure Laterality Date   COLONOSCOPY  01/30/2003   OTHER SURGICAL HISTORY     Right arm surgery for compound fracture    QUADRICEPS TENDON REPAIR Left 05/08/2019   Procedure: REPAIR QUADRICEP TENDON;  Surgeon: Ernie Cough, MD;  Location: [facility];  Service: Orthopedics;  Laterality: Left;  90 mins   robotic assisted lap radical prostatectomy N/A 09/14/2016   prostate cancer- done pe [facility], Dr. Shona   Family History  Problem Relation Age of Onset   Hypertension Father    Heart failure Father        died age 60   Obesity Father    Varicose Veins Father    Colon cancer Paternal Grandmother    Esophageal cancer Neg Hx    Rectal cancer Neg Hx    Stomach cancer Neg Hx    Social History   Socioeconomic History   Marital status: Married    Spouse name: Not on file   Number of children: Not on file   Years of education: Not on file   Highest education level: Bachelor's degree (e.g., BA, AB, BS)  Occupational History   Not on file  Tobacco Use   Smoking status: Never   Smokeless tobacco: Never  Vaping Use   Vaping status: Never Used  Substance and Sexual Activity   Alcohol use: Yes    Alcohol/week: 3.0 standard drinks of alcohol    Types: 3 Shots of liquor per week    Comment: drink socially mostly on weekends   Drug use: No   Sexual activity: Yes    Birth control/protection: None  Other Topics Concern   Not on file  Social History Narrative   Not on file   Social Drivers of Health   Financial Resource Strain: Low Risk  (04/16/2024)   Overall Financial Resource Strain (CARDIA)    Difficulty of Paying Living Expenses: Not hard at all  Food Insecurity: No Food Insecurity (04/16/2024)   Hunger Vital Sign     Worried About Running Out of Food in the Last Year: Never true    Ran Out of Food in the Last Year: Never true  Transportation Needs: No Transportation Needs (04/16/2024)   PRAPARE - Administrator, Civil Service (Medical): No    Lack of Transportation (Non-Medical): No  Physical Activity: Sufficiently Active (04/16/2024)   Exercise Vital Sign    Days of Exercise per Week: 5 days    Minutes of Exercise per Session: 50 min  Stress: No Stress Concern Present (04/16/2024)   [employer] of Occupational Health - Occupational Stress Questionnaire    Feeling of Stress: Not at all  Social Connections: Socially Integrated (04/16/2024)   Social Connection  and Isolation Panel    Frequency of Communication with Friends and Family: More than three times a week    Frequency of Social Gatherings with Friends and Family: Three times a week    Attends Religious Services: More than 4 times per year    Active Member of Clubs or Organizations: Yes    Attends Engineer, structural: More than 4 times per year    Marital Status: Married    Tobacco Counseling Counseling given: Not Answered    Clinical Intake:  Pre-visit preparation completed: Yes  Pain : No/denies pain     BMI - recorded: 35.44 Nutritional Status: BMI > 30  Obese Nutritional Risks: None Diabetes: No  Lab Results  Component Value Date   HGBA1C 6.2 12/28/2023   HGBA1C 6.3 06/29/2023   HGBA1C 6.3 12/23/2022     How often do you need to have someone help you when you read instructions, pamphlets, or other written materials from your doctor or pharmacy?: 1 - Never What is the last grade level you completed in school?: college degree  Interpreter Needed?: No  Information entered by :: Desmund Elman,CMA   Activities of Daily Living     04/16/2024   11:06 PM  In your present state of health, do you have any difficulty performing the following activities:  Hearing? 0  Vision? 0  Difficulty  concentrating or making decisions? 0  Walking or climbing stairs? 0  Dressing or bathing? 0  Doing errands, shopping? 0  Preparing Food and eating ? N  Using the Toilet? N  In the past six months, have you accidently leaked urine? N  Do you have problems with loss of bowel control? N  Managing your Medications? N  Managing your Finances? N  Housekeeping or managing your Housekeeping? N    Patient Care Team: Copland, Harlene BROCKS, MD as PCP - General (Family Medicine)  I have updated your Care Teams any recent Medical Services you may have received from other providers in the past year.     Assessment:   This is a routine wellness examination for Czar.  Hearing/Vision screen Hearing Screening - Comments:: No difficulties Vision Screening - Comments:: Patient wears glasses    Goals Addressed             This Visit's Progress    Maintain healthy active lifestyle.   On track    Patient Stated   On track    Increase stregnth training       Depression Screen     04/18/2024    1:58 PM 06/29/2023    8:55 AM 04/14/2023    9:00 AM 12/23/2022    8:53 AM 04/12/2022    9:05 AM 04/02/2021   11:50 AM 04/01/2020    2:45 PM  PHQ 2/9 Scores  PHQ - 2 Score 0 0 0 0 0 0 0  PHQ- 9 Score 0          Fall Risk     04/16/2024   11:06 PM 06/29/2023    8:55 AM 04/11/2023    4:09 PM 12/23/2022    8:53 AM 04/12/2022    9:04 AM  Fall Risk   Falls in the past year? 0 0 0 0 0  Number falls in past yr: 0 0 0 0 0  Injury with Fall? 0 0 0 0 0  Risk for fall due to : History of fall(s) No Fall Risks No Fall Risks No Fall Risks No Fall Risks  Follow up Falls evaluation completed Falls evaluation completed Falls evaluation completed Falls evaluation completed Falls evaluation completed      Data saved with a previous flowsheet row definition    MEDICARE RISK AT HOME:  Medicare Risk at Home Any stairs in or around the home?: (Patient-Rptd) Yes If so, are there any without handrails?:  (Patient-Rptd) No Home free of loose throw rugs in walkways, pet beds, electrical cords, etc?: (Patient-Rptd) Yes Adequate lighting in your home to reduce risk of falls?: (Patient-Rptd) Yes Life alert?: (Patient-Rptd) No Use of a cane, walker or w/c?: (Patient-Rptd) No Grab bars in the bathroom?: (Patient-Rptd) Yes Shower chair or bench in shower?: (Patient-Rptd) No Elevated toilet seat or a handicapped toilet?: (Patient-Rptd) Yes  TIMED UP AND GO:  Was the test performed?  No  Cognitive Function: 6CIT completed        04/18/2024    1:56 PM 04/14/2023    9:02 AM 04/12/2022    9:13 AM  6CIT Screen  What Year? 0 points 0 points 0 points  What month? 0 points 0 points 0 points  What time? 0 points 0 points 0 points  Count back from 20 0 points 0 points 0 points  Months in reverse 0 points 0 points 0 points  Repeat phrase 0 points 0 points 0 points  Total Score 0 points 0 points 0 points    Immunizations Immunization History  Administered Date(s) Administered   Fluad Quad(high Dose 65+) 07/04/2019, 06/29/2021   Fluad Trivalent(High Dose 65+) 06/29/2023   Influenza, High Dose Seasonal PF 07/21/2016, 06/13/2017, 09/11/2018   Influenza,inj,Quad PF,6+ Mos 06/30/2022   Influenza-Unspecified 08/04/2020   Moderna Covid-19 Vaccine Bivalent Booster 54yrs & up 12/23/2022   Moderna Sars-Covid-2 Vaccination 11/03/2019, 12/03/2019, 08/24/2020, 08/17/2021   Pfizer(Comirnaty)Fall Seasonal Vaccine 12 years and older 09/14/2023   Pneumococcal Conjugate-13 05/20/2016   Pneumococcal Polysaccharide-23 06/13/2017   Tdap 10/04/2014   Zoster Recombinant(Shingrix ) 01/19/2019, 04/09/2019    Screening Tests Health Maintenance  Topic Date Due   COVID-19 Vaccine (7 - Moderna risk 2024-25 season) 03/14/2024   INFLUENZA VACCINE  05/04/2024   DTaP/Tdap/Td (2 - Td or Tdap) 10/04/2024   Colonoscopy  11/30/2024   Medicare Annual Wellness (AWV)  04/18/2025   Pneumococcal Vaccine: 50+ Years  Completed    Hepatitis C Screening  Completed   Zoster Vaccines- Shingrix   Completed   Hepatitis B Vaccines  Aged Out   HPV VACCINES  Aged Out   Meningococcal B Vaccine  Aged Out    Health Maintenance  Health Maintenance Due  Topic Date Due   COVID-19 Vaccine (7 - Moderna risk 2024-25 season) 03/14/2024   Health Maintenance Items Addressed:patient declined vaccination   Additional Screening:  Vision Screening: Recommended annual ophthalmology exams for early detection of glaucoma and other disorders of the eye. Would you like a referral to an eye doctor? No    Dental Screening: Recommended annual dental exams for proper oral hygiene  Community Resource Referral / Chronic Care Management: CRR required this visit?  No   CCM required this visit?  No   Plan:    I have personally reviewed and noted the following in the patient's chart:   Medical and social history Use of alcohol, tobacco or illicit drugs  Current medications and supplements including opioid prescriptions. Patient is not currently taking opioid prescriptions. Functional ability and status Nutritional status Physical activity Advanced directives List of other physicians Hospitalizations, surgeries, and ER visits in previous 12 months Vitals Screenings to include cognitive,  depression, and falls Referrals and appointments  In addition, I have reviewed and discussed with patient certain preventive protocols, quality metrics, and best practice recommendations. A written personalized care plan for preventive services as well as general preventive health recommendations were provided to patient.   Lyle MARLA Right, [place]   04/18/2024   After Visit Summary: (MyChart) Due to this being a telephonic visit, the after visit summary with patients personalized plan was offered to patient via MyChart   Notes: Nothing significant to report at this time.

## 2024-04-18 NOTE — Patient Instructions (Signed)
 [name] , Thank you for taking time out of your busy schedule to complete your Annual Wellness Visit with me. I enjoyed our conversation and look forward to speaking with you again next year. I, as well as your care team,  appreciate your ongoing commitment to your health goals. Please review the following plan we discussed and let me know if I can assist you in the future. Your Game plan/ To Do List    Referrals: If you haven't heard from the office you've been referred to, please reach out to them at the phone provided.  none Follow up Visits: Next Medicare AWV with our clinical staff: 04/24/2025   Have you seen your provider in the last 6 months (3 months if uncontrolled diabetes)? Yes Next Office Visit with your provider: 07/04/2024  Clinician Recommendations:  Aim for 30 minutes of exercise or brisk walking, 6-8 glasses of water, and 5 servings of fruits and vegetables each day.       This is a list of the screening recommended for you and due dates:  Health Maintenance  Topic Date Due   COVID-19 Vaccine (7 - Moderna risk 2024-25 season) 03/14/2024   Flu Shot  05/04/2024   DTaP/Tdap/Td vaccine (2 - Td or Tdap) 10/04/2024   Colon Cancer Screening  11/30/2024   Medicare Annual Wellness Visit  04/18/2025   Pneumococcal Vaccine for age over 37  Completed   Hepatitis C Screening  Completed   Zoster (Shingles) Vaccine  Completed   Hepatitis B Vaccine  Aged Out   HPV Vaccine  Aged Out   Meningitis B Vaccine  Aged Out    Advanced directives: (Copy Requested) Please bring a copy of your health care power of attorney and living will to the office to be added to your chart at your convenience. You can mail to [facility] [address] [place], [place] [address] or email to [email or web address] Advance Care Planning is important because it:  [x]  Makes sure you receive the medical care that is consistent with your values, goals, and preferences  [x]  It provides  guidance to your family and loved ones and reduces their decisional burden about whether or not they are making the right decisions based on your wishes.  Follow the link provided in your after visit summary or read over the paperwork we have mailed to you to help you started getting your Advance Directives in place. If you need assistance in completing these, please reach out to us  so that we can help you!  See attachments for Preventive Care and Fall Prevention Tips.

## 2024-05-22 DIAGNOSIS — R3915 Urgency of urination: Secondary | ICD-10-CM | POA: Diagnosis not present

## 2024-05-31 ENCOUNTER — Other Ambulatory Visit: Payer: Self-pay | Admitting: Family Medicine

## 2024-05-31 DIAGNOSIS — E785 Hyperlipidemia, unspecified: Secondary | ICD-10-CM

## 2024-05-31 DIAGNOSIS — R030 Elevated blood-pressure reading, without diagnosis of hypertension: Secondary | ICD-10-CM

## 2024-06-13 DIAGNOSIS — D225 Melanocytic nevi of trunk: Secondary | ICD-10-CM | POA: Diagnosis not present

## 2024-06-13 DIAGNOSIS — X32XXXD Exposure to sunlight, subsequent encounter: Secondary | ICD-10-CM | POA: Diagnosis not present

## 2024-06-13 DIAGNOSIS — Z1283 Encounter for screening for malignant neoplasm of skin: Secondary | ICD-10-CM | POA: Diagnosis not present

## 2024-06-13 DIAGNOSIS — L570 Actinic keratosis: Secondary | ICD-10-CM | POA: Diagnosis not present

## 2024-06-27 ENCOUNTER — Ambulatory Visit: Admitting: Family Medicine

## 2024-06-30 NOTE — Progress Notes (Deleted)
 [facility] [address], [address] [place], [place] [address] [phone]  Date:  07/04/2024   Name:  Jason Obrien   DOB:  02-23-1951   MRN:  [id number]  PCP:  Jason Harlene BROCKS, MD    Chief Complaint: No chief complaint on file.   History of Present Illness:  Jason Obrien is a 73 y.o. very pleasant male patient who presents with the following:  Patient seen today for follow-up.  I saw him most recently in March History of dyslipidemia, prediabetes,prostate cancer status post prostatectomy 2017, left quad tear 2020 which was repaired in August 2020. He does have history of ascending aortic dilation seen on previous coronary calcium  screening CT, but his most recent CT angiogram performed in June of this year showed normal aorta  He is retired from his work as a Nurse, mental health Married to Jason Obrien, they have 2 children and 2 grandchildren who live in [place] .   He follows with Dr. Rudolpho, urology for prostate cancer surveillance  -Flu vaccine - Recommend COVID booster -RSV -Shingrix  is complete - Most recent blood work on chart from March and June Can update A1c today if he would like Can also offer a PSA if needed for urology  Discussed the use of AI scribe software for clinical note transcription with the patient, who gave verbal consent to proceed.  History of Present Illness    Patient Active Problem List   Diagnosis Date Noted   Ascending aorta dilatation 07/05/2022   Left quad rutpure 05/08/2019   Dyslipidemia 06/14/2017   Prostate cancer (HCC) 07/21/2016   Pre-diabetes 07/21/2016   Nephrolithiasis 07/07/2016   Elevated PSA 05/31/2016   Overweight 05/20/2016    Past Medical History:  Diagnosis Date   Arthritis    Cancer [facility])    Prostate Cancer for SX in Dec.2017   Cataract    FORMING BILATERAL   Chicken pox    childhood   Diabetes mellitus without complication (HCC)     PREDIABETIC   History of kidney stones 03/2018   Hyperlipidemia    Kidney stones    In 2013 or 2014   Pre-diabetes     Past Surgical History:  Procedure Laterality Date   COLONOSCOPY  01/30/2003   OTHER SURGICAL HISTORY     Right arm surgery for compound fracture    QUADRICEPS TENDON REPAIR Left 05/08/2019   Procedure: REPAIR QUADRICEP TENDON;  Surgeon: Ernie Cough, MD;  Location: [facility];  Service: Orthopedics;  Laterality: Left;  90 mins   robotic assisted lap radical prostatectomy N/A 09/14/2016   prostate cancer- done pe [facility], Dr. Shona    Social History   Tobacco Use   Smoking status: Never   Smokeless tobacco: Never  Vaping Use   Vaping status: Never Used  Substance Use Topics   Alcohol use: Yes    Alcohol/week: 3.0 standard drinks of alcohol    Types: 3 Shots of liquor per week    Comment: drink socially mostly on weekends   Drug use: No    Family History  Problem Relation Age of Onset   Hypertension Father    Heart failure Father        died age 32   Obesity Father    Varicose Veins Father    Colon cancer Paternal Grandmother    Esophageal cancer Neg Hx    Rectal cancer Neg Hx    Stomach cancer Neg  Hx     No Known Allergies  Medication list has been reviewed and updated.  Current Outpatient Medications on File Prior to Visit  Medication Sig Dispense Refill   aspirin  EC 81 MG tablet Take 81 mg by mouth daily. Swallow whole.     Glucosamine-Chondroit-Vit C-Mn (GLUCOSAMINE 1500 COMPLEX) CAPS Take by mouth.     lisinopril  (ZESTRIL ) 10 MG tablet TAKE 1 TABLET BY MOUTH EVERY DAY 90 tablet 3   metFORMIN  (GLUCOPHAGE ) 500 MG tablet TAKE 1 TABLET (500 MG TOTAL) BY MOUTH DAILY. 90 tablet 3   Multiple Vitamins-Minerals (ONE-A-DAY MENS 50+ ADVANTAGE PO) Take 1 capsule by mouth daily.     Omega-3 Fatty Acids (FISH OIL ULTRA) 1400 MG CAPS Take 1,400 mg by mouth daily.      rosuvastatin  (CRESTOR ) 20 MG tablet TAKE 1 TABLET BY MOUTH EVERY DAY 90 tablet 3   No current  facility-administered medications on file prior to visit.    Review of Systems:  As per HPI- otherwise negative.  Physical Examination: There were no vitals filed for this visit. There were no vitals filed for this visit. There is no height or weight on file to calculate BMI. Ideal Body Weight:    GEN: no acute distress. HEENT: Atraumatic, Normocephalic.  Ears and Nose: No external deformity. CV: RRR, No M/G/R. No JVD. No thrill. No extra heart sounds. PULM: CTA B, no wheezes, crackles, rhonchi. No retractions. No resp. distress. No accessory muscle use. ABD: S, NT, ND, +BS. No rebound. No HSM. EXTR: No c/c/e PSYCH: Normally interactive. Conversant.    Assessment and Plan: No diagnosis found.  Assessment & Plan   Signed Harlene Schroeder, MD

## 2024-07-04 ENCOUNTER — Ambulatory Visit: Admitting: Family Medicine

## 2024-07-09 NOTE — Progress Notes (Addendum)
 Jason Obrien [address] [place], [place] [address] [phone]  Date:  07/11/2024   Name:  Jason Obrien   DOB:  1951-03-10   MRN:  [id number]  PCP:  Jason Harlene BROCKS, MD    Chief Complaint: No chief complaint on file.   History of Present Illness:  Jason Obrien is a 73 y.o. very pleasant male patient who presents with the following:  Patient seen today for follow-up.  I saw him most recently in March History of dyslipidemia, prediabetes,prostate cancer status post prostatectomy 2017, left quad tear 2020 which was repaired in August 2020. He does have history of ascending aortic dilation seen on previous coronary calcium  screening CT, but his most recent CT angiogram performed in June of this year showed normal aorta  He is retired from his work as a Nurse, mental health Married to Jason Obrien, they have 2 children and 2 grandchildren who live in [place] .   He follows with Dr. Rudolpho, urology for prostate cancer surveillance  -Flu vaccine - Recommend COVID booster -RSV -Shingrix  is complete - Most recent blood work on chart from March and June Can update A1c today if he would like Can also offer a PSA if needed for urology  Discussed the use of AI scribe software for clinical note transcription with the patient, who gave verbal consent to proceed.  History of Present Illness    Patient Active Problem List   Diagnosis Date Noted   Ascending aorta dilatation 07/05/2022   Left quad rutpure 05/08/2019   Dyslipidemia 06/14/2017   Prostate cancer (HCC) 07/21/2016   Pre-diabetes 07/21/2016   Nephrolithiasis 07/07/2016   Elevated PSA 05/31/2016   Overweight 05/20/2016    Past Medical History:  Diagnosis Date   Arthritis    Cancer [facility])    Prostate Cancer for SX in Dec.2017   Cataract    FORMING BILATERAL   Chicken pox    childhood   Diabetes mellitus without complication (HCC)     PREDIABETIC   History of kidney stones 03/2018   Hyperlipidemia    Kidney stones    In 2013 or 2014   Pre-diabetes     Past Surgical History:  Procedure Laterality Date   COLONOSCOPY  01/30/2003   OTHER SURGICAL HISTORY     Right arm surgery for compound fracture    QUADRICEPS TENDON REPAIR Left 05/08/2019   Procedure: REPAIR QUADRICEP TENDON;  Surgeon: Ernie Cough, MD;  Location: [facility];  Service: Orthopedics;  Laterality: Left;  90 mins   robotic assisted lap radical prostatectomy N/A 09/14/2016   prostate cancer- done pe [facility], Dr. Shona    Social History   Tobacco Use   Smoking status: Never   Smokeless tobacco: Never  Vaping Use   Vaping status: Never Used  Substance Use Topics   Alcohol use: Yes    Alcohol/week: 3.0 standard drinks of alcohol    Types: 3 Shots of liquor per week    Comment: drink socially mostly on weekends   Drug use: No    Family History  Problem Relation Age of Onset   Hypertension Father    Heart failure Father        died age 30   Obesity Father    Varicose Veins Father    Colon cancer Paternal Grandmother    Esophageal cancer Neg Hx    Rectal cancer Neg Hx    Stomach cancer Neg  Hx     No Known Allergies  Medication list has been reviewed and updated.  Current Outpatient Medications on File Prior to Visit  Medication Sig Dispense Refill   aspirin  EC 81 MG tablet Take 81 mg by mouth daily. Swallow whole.     Glucosamine-Chondroit-Vit C-Mn (GLUCOSAMINE 1500 COMPLEX) CAPS Take by mouth.     lisinopril  (ZESTRIL ) 10 MG tablet TAKE 1 TABLET BY MOUTH EVERY DAY 90 tablet 3   metFORMIN  (GLUCOPHAGE ) 500 MG tablet TAKE 1 TABLET (500 MG TOTAL) BY MOUTH DAILY. 90 tablet 3   Multiple Vitamins-Minerals (ONE-A-DAY MENS 50+ ADVANTAGE PO) Take 1 capsule by mouth daily.     Omega-3 Fatty Acids (FISH OIL ULTRA) 1400 MG CAPS Take 1,400 mg by mouth daily.      rosuvastatin  (CRESTOR ) 20 MG tablet TAKE 1 TABLET BY MOUTH EVERY DAY 90 tablet 3   No current  facility-administered medications on file prior to visit.    Review of Systems:  As per HPI- otherwise negative.  Physical Examination: There were no vitals filed for this visit. There were no vitals filed for this visit. There is no height or weight on file to calculate BMI. Ideal Body Weight:    GEN: no acute distress. HEENT: Atraumatic, Normocephalic.  Ears and Nose: No external deformity. CV: RRR, No M/G/R. No JVD. No thrill. No extra heart sounds. PULM: CTA B, no wheezes, crackles, rhonchi. No retractions. No resp. distress. No accessory muscle use. ABD: S, NT, ND, +BS. No rebound. No HSM. EXTR: No c/c/e PSYCH: Normally interactive. Conversant.    Assessment and Plan: No diagnosis found.  Assessment & Plan   Signed Harlene Schroeder, MD

## 2024-07-10 DIAGNOSIS — R399 Unspecified symptoms and signs involving the genitourinary system: Secondary | ICD-10-CM | POA: Diagnosis not present

## 2024-07-11 ENCOUNTER — Encounter: Payer: Self-pay | Admitting: Family Medicine

## 2024-07-11 ENCOUNTER — Ambulatory Visit (INDEPENDENT_AMBULATORY_CARE_PROVIDER_SITE_OTHER): Admitting: Family Medicine

## 2024-07-11 VITALS — BP 132/64 | HR 52 | Temp 98.9°F | Ht 69.0 in | Wt 238.0 lb

## 2024-07-11 DIAGNOSIS — Z23 Encounter for immunization: Secondary | ICD-10-CM

## 2024-07-11 DIAGNOSIS — R7303 Prediabetes: Secondary | ICD-10-CM | POA: Diagnosis not present

## 2024-07-11 DIAGNOSIS — C61 Malignant neoplasm of prostate: Secondary | ICD-10-CM | POA: Diagnosis not present

## 2024-07-11 DIAGNOSIS — N201 Calculus of ureter: Secondary | ICD-10-CM | POA: Diagnosis not present

## 2024-07-11 MED ORDER — METFORMIN HCL 500 MG PO TABS: 500.0000 mg | ORAL_TABLET | Freq: Every day | ORAL | 3 refills | Status: AC

## 2024-07-11 NOTE — Patient Instructions (Signed)
 Good to see you today- I will be in touch with your labs  Recommend covid booster and one dose RSV If all is well please see me in 6 months

## 2024-07-12 ENCOUNTER — Encounter: Payer: Self-pay | Admitting: Family Medicine

## 2024-07-12 LAB — HEMOGLOBIN A1C: Hgb A1c MFr Bld: 6.5 % (ref 4.6–6.5)

## 2024-07-12 LAB — PSA: PSA: 0 ng/mL — ABNORMAL LOW (ref 0.10–4.00)

## 2025-01-02 ENCOUNTER — Ambulatory Visit: Admitting: Family Medicine

## 2025-04-24 ENCOUNTER — Ambulatory Visit
